# Patient Record
Sex: Male | Born: 1955 | Race: White | Hispanic: No | Marital: Married | Smoking: Never smoker
Health system: Southern US, Community
[De-identification: ages and names within clinical notes are randomized; demographics above are authoritative.]

## PROBLEM LIST (undated history)

## (undated) DIAGNOSIS — H9190 Unspecified hearing loss, unspecified ear: Secondary | ICD-10-CM

## (undated) DIAGNOSIS — N200 Calculus of kidney: Secondary | ICD-10-CM

## (undated) DIAGNOSIS — M1990 Unspecified osteoarthritis, unspecified site: Secondary | ICD-10-CM

## (undated) DIAGNOSIS — R000 Tachycardia, unspecified: Secondary | ICD-10-CM

## (undated) DIAGNOSIS — M109 Gout, unspecified: Secondary | ICD-10-CM

## (undated) DIAGNOSIS — I10 Essential (primary) hypertension: Secondary | ICD-10-CM

## (undated) HISTORY — PX: APPENDECTOMY: SHX54

## (undated) HISTORY — PX: OTHER SURGICAL HISTORY: SHX169

## (undated) HISTORY — DX: Gout, unspecified: M10.9

## (undated) HISTORY — DX: Unspecified osteoarthritis, unspecified site: M19.90

## (undated) HISTORY — PX: CHOLECYSTECTOMY: SHX55

## (undated) HISTORY — PX: LITHOTRIPSY: SUR834

---

## 2004-11-10 ENCOUNTER — Ambulatory Visit: Payer: Self-pay | Admitting: Urology

## 2004-11-16 ENCOUNTER — Emergency Department: Payer: Self-pay | Admitting: Internal Medicine

## 2004-12-05 ENCOUNTER — Other Ambulatory Visit: Payer: Self-pay

## 2005-11-21 ENCOUNTER — Emergency Department: Payer: Self-pay | Admitting: Unknown Physician Specialty

## 2005-11-26 ENCOUNTER — Other Ambulatory Visit: Payer: Self-pay

## 2005-11-29 ENCOUNTER — Ambulatory Visit: Payer: Self-pay | Admitting: Urology

## 2005-12-06 ENCOUNTER — Ambulatory Visit: Payer: Self-pay | Admitting: Urology

## 2005-12-24 ENCOUNTER — Ambulatory Visit: Payer: Self-pay | Admitting: Urology

## 2007-08-14 ENCOUNTER — Ambulatory Visit: Payer: Self-pay | Admitting: Family Medicine

## 2009-10-26 ENCOUNTER — Ambulatory Visit: Payer: Self-pay | Admitting: Urology

## 2009-10-27 ENCOUNTER — Ambulatory Visit: Payer: Self-pay | Admitting: Urology

## 2014-03-15 ENCOUNTER — Ambulatory Visit: Payer: Self-pay

## 2014-03-15 ENCOUNTER — Ambulatory Visit: Payer: Self-pay | Admitting: Physician Assistant

## 2014-03-15 LAB — URINALYSIS, COMPLETE
BILIRUBIN, UR: NEGATIVE
Glucose,UR: NEGATIVE
KETONE: NEGATIVE
NITRITE: POSITIVE
PH: 5.5 (ref 5.0–8.0)
Specific Gravity: 1.02 (ref 1.000–1.030)
Squamous Epithelial: NONE SEEN
WBC UR: >30 /HPF (ref 0–5)

## 2014-03-17 LAB — URINE CULTURE

## 2014-09-24 ENCOUNTER — Emergency Department: Payer: Self-pay | Admitting: Emergency Medicine

## 2014-09-24 LAB — CBC
HCT: 53.3 % — AB (ref 40.0–52.0)
HGB: 18 g/dL (ref 13.0–18.0)
MCH: 28.6 pg (ref 26.0–34.0)
MCHC: 33.7 g/dL (ref 32.0–36.0)
MCV: 85 fL (ref 80–100)
PLATELETS: 189 10*3/uL (ref 150–440)
RBC: 6.27 10*6/uL — AB (ref 4.40–5.90)
RDW: 13.5 % (ref 11.5–14.5)
WBC: 9.7 10*3/uL (ref 3.8–10.6)

## 2014-09-24 LAB — COMPREHENSIVE METABOLIC PANEL
ALBUMIN: 4.7 g/dL
ALK PHOS: 73 U/L
ALT: 37 U/L
Anion Gap: 9 (ref 7–16)
BILIRUBIN TOTAL: 1.9 mg/dL — AB
BUN: 14 mg/dL
CHLORIDE: 107 mmol/L
CO2: 24 mmol/L
Calcium, Total: 9.7 mg/dL
Creatinine: 1.04 mg/dL
EGFR (African American): >60
EGFR (Non-African Amer.): >60
GLUCOSE: 136 mg/dL — AB
Potassium: 4.2 mmol/L
SGOT(AST): 31 U/L
SODIUM: 140 mmol/L
TOTAL PROTEIN: 7.4 g/dL

## 2014-09-24 LAB — URINALYSIS, COMPLETE
Bilirubin,UR: NEGATIVE
GLUCOSE, UR: NEGATIVE mg/dL (ref 0–75)
Ketone: NEGATIVE
NITRITE: NEGATIVE
Ph: 5 (ref 4.5–8.0)
Protein: 100
RBC,UR: 151 /HPF (ref 0–5)
Specific Gravity: 1.023 (ref 1.003–1.030)
Squamous Epithelial: NONE SEEN
WBC UR: 3 /HPF (ref 0–5)

## 2015-02-25 ENCOUNTER — Ambulatory Visit
Admission: RE | Admit: 2015-02-25 | Discharge: 2015-02-25 | Disposition: A | Discharge status: Home / Self Care | Payer: 59 | Source: Ambulatory Visit | Attending: Family Medicine | Admitting: Family Medicine

## 2015-02-25 ENCOUNTER — Other Ambulatory Visit: Payer: Self-pay | Admitting: Family Medicine

## 2015-02-25 DIAGNOSIS — R339 Retention of urine, unspecified: Secondary | ICD-10-CM

## 2015-02-25 DIAGNOSIS — K5900 Constipation, unspecified: Secondary | ICD-10-CM

## 2016-07-14 ENCOUNTER — Emergency Department
Admission: EM | Admit: 2016-07-14 | Discharge: 2016-07-14 | Disposition: A | Discharge status: Home / Self Care | Payer: 59 | Attending: Emergency Medicine | Admitting: Emergency Medicine

## 2016-07-14 ENCOUNTER — Encounter: Payer: Self-pay | Admitting: *Deleted

## 2016-07-14 ENCOUNTER — Emergency Department: Payer: 59

## 2016-07-14 DIAGNOSIS — K5732 Diverticulitis of large intestine without perforation or abscess without bleeding: Secondary | ICD-10-CM | POA: Diagnosis not present

## 2016-07-14 DIAGNOSIS — N200 Calculus of kidney: Secondary | ICD-10-CM | POA: Diagnosis not present

## 2016-07-14 DIAGNOSIS — R109 Unspecified abdominal pain: Secondary | ICD-10-CM | POA: Diagnosis present

## 2016-07-14 DIAGNOSIS — Z79899 Other long term (current) drug therapy: Secondary | ICD-10-CM | POA: Diagnosis not present

## 2016-07-14 DIAGNOSIS — I10 Essential (primary) hypertension: Secondary | ICD-10-CM | POA: Insufficient documentation

## 2016-07-14 HISTORY — DX: Calculus of kidney: N20.0

## 2016-07-14 HISTORY — DX: Essential (primary) hypertension: I10

## 2016-07-14 LAB — BASIC METABOLIC PANEL
Anion gap: 7 (ref 5–15)
BUN: 10 mg/dL (ref 6–20)
CALCIUM: 9.1 mg/dL (ref 8.9–10.3)
CO2: 23 mmol/L (ref 22–32)
CREATININE: 0.94 mg/dL (ref 0.61–1.24)
Chloride: 107 mmol/L (ref 101–111)
GFR calc non Af Amer: >60 mL/min (ref >60)
Glucose, Bld: 115 mg/dL — ABNORMAL HIGH (ref 65–99)
Potassium: 4 mmol/L (ref 3.5–5.1)
SODIUM: 137 mmol/L (ref 135–145)

## 2016-07-14 LAB — URINALYSIS, COMPLETE (UACMP) WITH MICROSCOPIC
BACTERIA UA: NONE SEEN
BILIRUBIN URINE: NEGATIVE
Glucose, UA: NEGATIVE mg/dL
KETONES UR: 20 mg/dL — AB
Leukocytes, UA: NEGATIVE
Nitrite: NEGATIVE
Protein, ur: 100 mg/dL — AB
SQUAMOUS EPITHELIAL / LPF: NONE SEEN
Specific Gravity, Urine: 1.019 (ref 1.005–1.030)
pH: 6 (ref 5.0–8.0)

## 2016-07-14 LAB — CBC
HCT: 51.7 % (ref 40.0–52.0)
Hemoglobin: 17.9 g/dL (ref 13.0–18.0)
MCH: 28.9 pg (ref 26.0–34.0)
MCHC: 34.7 g/dL (ref 32.0–36.0)
MCV: 83.2 fL (ref 80.0–100.0)
PLATELETS: 181 10*3/uL (ref 150–440)
RBC: 6.21 MIL/uL — ABNORMAL HIGH (ref 4.40–5.90)
RDW: 12.8 % (ref 11.5–14.5)
WBC: 14.4 10*3/uL — ABNORMAL HIGH (ref 3.8–10.6)

## 2016-07-14 MED ORDER — MORPHINE SULFATE (PF) 4 MG/ML IV SOLN
INTRAVENOUS | Status: AC
  Administered 2016-07-14: 4 mg via INTRAVENOUS
  Filled 2016-07-14: qty 1

## 2016-07-14 MED ORDER — HYDROMORPHONE HCL 1 MG/ML IJ SOLN
1.0000 mg | Freq: Once | INTRAMUSCULAR | Status: AC
  Administered 2016-07-14: 1 mg via INTRAVENOUS
  Filled 2016-07-14: qty 1

## 2016-07-14 MED ORDER — ONDANSETRON HCL 4 MG/2ML IJ SOLN
4.0000 mg | Freq: Once | INTRAMUSCULAR | Status: AC
  Administered 2016-07-14: 4 mg via INTRAVENOUS

## 2016-07-14 MED ORDER — SODIUM CHLORIDE 0.9 % IV BOLUS (SEPSIS)
1000.0000 mL | Freq: Once | INTRAVENOUS | Status: AC
  Administered 2016-07-14: 1000 mL via INTRAVENOUS

## 2016-07-14 MED ORDER — MORPHINE SULFATE (PF) 4 MG/ML IV SOLN
4.0000 mg | Freq: Once | INTRAVENOUS | Status: AC
  Administered 2016-07-14: 4 mg via INTRAVENOUS

## 2016-07-14 MED ORDER — OXYCODONE-ACETAMINOPHEN 5-325 MG PO TABS: 1.0000 | ORAL_TABLET | ORAL | 0 refills | Status: DC | PRN

## 2016-07-14 MED ORDER — AMOXICILLIN-POT CLAVULANATE 875-125 MG PO TABS: 1.0000 | ORAL_TABLET | Freq: Two times a day (BID) | ORAL | 0 refills | Status: AC

## 2016-07-14 MED ORDER — TAMSULOSIN HCL 0.4 MG PO CAPS: 0.4000 mg | ORAL_CAPSULE | Freq: Every day | ORAL | 0 refills | Status: DC

## 2016-07-14 MED ORDER — ONDANSETRON HCL 4 MG/2ML IJ SOLN
INTRAMUSCULAR | Status: AC
  Administered 2016-07-14: 4 mg via INTRAVENOUS
  Filled 2016-07-14: qty 2

## 2016-07-14 MED ORDER — AMOXICILLIN-POT CLAVULANATE 875-125 MG PO TABS
1.0000 | ORAL_TABLET | Freq: Once | ORAL | Status: AC
  Administered 2016-07-14: 1 via ORAL
  Filled 2016-07-14: qty 1

## 2016-07-14 MED ORDER — ONDANSETRON HCL 4 MG PO TABS: 4.0000 mg | ORAL_TABLET | Freq: Every day | ORAL | 1 refills | Status: AC | PRN

## 2016-07-14 NOTE — ED Notes (Signed)
Patient c/o pelvic/flank pain, nausea, urinary retention, frequent urination, decreased urinary flow beginning 07/12/16

## 2016-07-14 NOTE — ED Provider Notes (Signed)
[facility] Emergency Department Provider Note   First MD Initiated Contact with Patient 07/14/16 0530     (approximate)  I have reviewed the triage vital signs and the nursing notes.   HISTORY  Chief Complaint Urinary Retention   HPI Jordan Hughes is a 61 y.o. male with bolus of chronic medical conditions presents to the emergency department with bilateral flank pain that is currently 9 out of 10 as well as feeling of inability to urinate 1 day. Patient states symptoms consistent with previous episodes of kidney stones. Patient admits to nausea however no vomiting.   Past Medical History:  Diagnosis Date  . Hypertension   . Kidney stones     There are no active problems to display for this patient.   Past Surgical History:  Procedure Laterality Date  . APPENDECTOMY    . CHOLECYSTECTOMY      Prior to Admission medications   Medication Sig Start Date End Date Taking? Authorizing Provider  ibuprofen (ADVIL,MOTRIN) 200 MG tablet Take 400 mg by mouth daily.   Yes Historical Provider, MD  lisinopril (PRINIVIL,ZESTRIL) 20 MG tablet Take 20 mg by mouth daily. 04/13/16  Yes Historical Provider, MD  tamsulosin (FLOMAX) 0.4 MG CAPS capsule Take 0.4 mg by mouth daily. 06/17/16  Yes Historical Provider, MD  amoxicillin-clavulanate (AUGMENTIN) 875-125 MG tablet Take 1 tablet by mouth 2 (two) times daily. 07/14/16 07/28/16  Gregor Hams, MD  ondansetron (ZOFRAN) 4 MG tablet Take 1 tablet (4 mg total) by mouth daily as needed for nausea or vomiting. 07/14/16 07/14/17  Gregor Hams, MD  oxyCODONE-acetaminophen (ROXICET) 5-325 MG tablet Take 1 tablet by mouth every 4 (four) hours as needed for severe pain. 07/14/16   Gregor Hams, MD  tamsulosin (FLOMAX) 0.4 MG CAPS capsule Take 1 capsule (0.4 mg total) by mouth daily after breakfast. 07/14/16   Gregor Hams, MD    Allergies Patient has no known allergies.  History reviewed. No pertinent family  history.  Social History Social History  Substance Use Topics  . Smoking status: Never Smoker  . Smokeless tobacco: Never Used  . Alcohol use 6.0 oz/week    10 Shots of liquor per week     Comment: only on Saturdays    Review of Systems Constitutional: No fever/chills Eyes: No visual changes. ENT: No sore throat. Cardiovascular: Denies chest pain. Respiratory: Denies shortness of breath. Gastrointestinal: No abdominal pain.  No nausea, no vomiting.  No diarrhea.  No constipation. Genitourinary: Negative for dysuria.Positive for urinary retention and flank pain Musculoskeletal: Negative for back pain. Skin: Negative for rash. Neurological: Negative for headaches, focal weakness or numbness.  10-point ROS otherwise negative.  ____________________________________________   PHYSICAL EXAM:  VITAL SIGNS: ED Triage Vitals  Enc Vitals Group     BP 07/14/16 0448 139/83     Pulse Rate 07/14/16 0448 90     Resp 07/14/16 0448 (!) 22     Temp 07/14/16 0448 98.2 F (36.8 C)     Temp Source 07/14/16 0448 Oral     SpO2 07/14/16 0448 98 %     Weight 07/14/16 0449 180 lb (81.6 kg)     Height 07/14/16 0449 5\' 6"  (1.676 m)     Head Circumference --      Peak Flow --      Pain Score 07/14/16 0449 9     Pain Loc --      Pain Edu? --  Excl. in [place]? --     Constitutional: Alert and oriented. Apparent discomfort  Eyes: Conjunctivae are normal. PERRL. EOMI. Head: Atraumatic. Mouth/Throat: Mucous membranes are moist.  Oropharynx non-erythematous. Neck: No stridor.   Cardiovascular: Normal rate, regular rhythm. Good peripheral circulation. Grossly normal heart sounds. Respiratory: Normal respiratory effort.  No retractions. Lungs CTAB. Gastrointestinal: Left lower quadrant tenderness palpation No distention.  Musculoskeletal: No lower extremity tenderness nor edema. No gross deformities of extremities. Neurologic:  Normal speech and language. No gross focal neurologic deficits are  appreciated.  Skin:  Skin is warm, dry and intact. No rash noted. Psychiatric: Mood and affect are normal. Speech and behavior are normal.  ____________________________________________   LABS (all labs ordered are listed, but only abnormal results are displayed)  Labs Reviewed  URINALYSIS, COMPLETE (UACMP) WITH MICROSCOPIC - Abnormal; Notable for the following:       Result Value   Color, Urine YELLOW (*)    APPearance CLEAR (*)    Hgb urine dipstick SMALL (*)    Ketones, ur 20 (*)    Protein, ur 100 (*)    All other components within normal limits  BASIC METABOLIC PANEL - Abnormal; Notable for the following:    Glucose, Bld 115 (*)    All other components within normal limits  CBC - Abnormal; Notable for the following:    WBC 14.4 (*)    RBC 6.21 (*)    All other components within normal limits     RADIOLOGY I, [place] Cyniah Gossard, personally viewed and evaluated these images (plain radiographs) as part of my medical decision making, as well as reviewing the written report by the radiologist.  Ct Renal Stone Study  Result Date: 07/14/2016 CLINICAL DATA:  Bilateral flank pain and hematuria. Low back pain started yesterday. Urine retention. Suprapubic pain. EXAM: CT ABDOMEN AND PELVIS WITHOUT CONTRAST TECHNIQUE: Multidetector CT imaging of the abdomen and pelvis was performed following the standard protocol without IV contrast. COMPARISON:  09/24/2014 FINDINGS: Lower chest: Lung bases are clear. Hepatobiliary: No focal liver abnormality is seen. Status post cholecystectomy. No biliary dilatation. Pancreas: Unremarkable. No pancreatic ductal dilatation or surrounding inflammatory changes. Spleen: Normal in size without focal abnormality. Adrenals/Urinary Tract: No adrenal gland nodules. Multiple bilateral intrarenal stones. Largest is in the right mid pole measuring 3 mm diameter. No hydronephrosis or hydroureter. No ureteral stones or bladder stones. Bladder wall is not thickened.  Stomach/Bowel: Diverticulosis of the sigmoid colon. Inflammatory infiltration in the fat around the sigmoid colon. Appearance is consistent with acute diverticulitis. No abscess. Colon is decompressed. Surgical absence of the uterus. Stomach and small bowel are unremarkable. Vascular/Lymphatic: Aortic atherosclerosis. No enlarged abdominal or pelvic lymph nodes. Reproductive: Prostate gland is enlarged, measuring 4.9 cm diameter. Prostate calcifications. Other: No free air or free fluid in the abdomen. Abdominal wall musculature appears in. Musculoskeletal: Mild degenerative changes in the spine. No destructive bone lesions. IMPRESSION: Sigmoid colon diverticulosis with inflammatory changes consistent with diverticulitis. No abscess. Multiple bilateral nonobstructing renal stones. Electronically Signed   By: Lucienne Capers M.D.   On: 07/14/2016 06:56    _____  Procedures     INITIAL IMPRESSION / ASSESSMENT AND PLAN / ED COURSE  Pertinent labs & imaging results that were available during my care of the patient were reviewed by me and considered in my medical decision making (see chart for details).  61 year old male presenting with bilateral flank pain concern for nephrolithiasis/ureterolithiasis given history of multiple kidney stones in the past. CT scan  revealed multiple bilateral nonobstructing renal stones as well as diverticulitis as such patient received IV morphine for pain control as well as Zofran for nausea. Patient given Augmentin and will be prescribed same for home   Clinical Course     ____________________________________________  FINAL CLINICAL IMPRESSION(S) / ED DIAGNOSES  Final diagnoses:  Kidney stones  Diverticulitis of large intestine without perforation or abscess without bleeding     MEDICATIONS GIVEN DURING THIS VISIT:  Medications  morphine 4 MG/ML injection 4 mg (4 mg Intravenous Given 07/14/16 0616)  ondansetron (ZOFRAN) injection 4 mg (4 mg Intravenous  Given 07/14/16 0614)  sodium chloride 0.9 % bolus 1,000 mL (0 mLs Intravenous Stopped 07/14/16 0722)  amoxicillin-clavulanate (AUGMENTIN) 875-125 MG per tablet 1 tablet (1 tablet Oral Given 07/14/16 0812)  HYDROmorphone (DILAUDID) injection 1 mg (1 mg Intravenous Given 07/14/16 0715)     NEW OUTPATIENT MEDICATIONS STARTED DURING THIS VISIT:  Discharge Medication List as of 07/14/2016  7:09 AM    START taking these medications   Details  amoxicillin-clavulanate (AUGMENTIN) 875-125 MG tablet Take 1 tablet by mouth 2 (two) times daily., Starting Sat 07/14/2016, Until Sat 07/28/2016, Print    ondansetron (ZOFRAN) 4 MG tablet Take 1 tablet (4 mg total) by mouth daily as needed for nausea or vomiting., Starting Sat 07/14/2016, Until Sun 07/14/2017, Print    oxyCODONE-acetaminophen (ROXICET) 5-325 MG tablet Take 1 tablet by mouth every 4 (four) hours as needed for severe pain., Starting Sat 07/14/2016, Print    !! tamsulosin (FLOMAX) 0.4 MG CAPS capsule Take 1 capsule (0.4 mg total) by mouth daily after breakfast., Starting Sat 07/14/2016, Print     !! - Potential duplicate medications found. Please discuss with provider.      Discharge Medication List as of 07/14/2016  7:09 AM      Discharge Medication List as of 07/14/2016  7:09 AM       Note:  This document was prepared using Dragon voice recognition software and may include unintentional dictation errors.    Gregor Hams, MD 07/15/16 [phone]

## 2016-07-14 NOTE — ED Notes (Signed)
Sarah NT and Eliezer Lofts RN performed in and out cath

## 2016-07-14 NOTE — ED Notes (Signed)
This tech bladder scanned pt with RN, Apolonio Schneiders assistance. Amt of urine found in bladder was 32ml

## 2016-07-14 NOTE — ED Triage Notes (Signed)
Pt presents w/ c/o urinary retention and low back pain starting yesterday. Pt states he is "leaking" urine but cannot produce a good stream. Pt c/o suprapubic pain. Pt ambulatory to stat desk where he was able to squat down to exhibit how much pain he was presently experiencing. Pt placed in [place]. Pt hyperventilating during triage process. Pt PWD.

## 2016-07-19 ENCOUNTER — Ambulatory Visit: Payer: Self-pay

## 2016-07-25 NOTE — Progress Notes (Signed)
07/26/2016 9:28 AM   Jordan Hughes 20-Dec-1955 [id number]  Referring provider: Lynnell Jude, MD [address] [place], [place] [id number]  Chief Complaint  Patient presents with  . New Patient (Initial Visit)    kidney stone referred by ER    HPI: Patient is a 61 year old Caucasian male who presents/is referred by [facility] ED for nephrolithiasis.  Patient presented to the ED with the complaint bilateral flank pain that is currently 9 out of 10 as well as feeling of inability to urinate 1 day. Patient states symptoms consistent with previous episodes of kidney stones. Patient admits to nausea however no vomiting.  He states the onset of the pain was two weeks ago.   The pain was in the suprapubic region.   It was dull.  It lasted for several hours.    Nothing made the pain better.   Nothing made the pain worse.  He did have gross hematuria, chills and vomiting.  In the ED, he received antibiotics, IV pain and nausea medications.  His UA was positive for 6-30 RBC's.   Serum creatinine was 0.94.  WBC count was 14.4.    CT Renal stone study performed on 07/14/2016 noted sigmoid colon diverticulosis with inflammatory changes consistent with diverticulitis. No abscess. Multiple bilateral nonobstructing renal stones.  I have independently reviewed the films.  He was discharged with tamsulosin 0.4 mg daily, Augmentin 875/125, Zofran and Percocet.    Today, he is having pain in the mid upper back.  He states he is not passing much urine, but he is not drinking a lot of water.   He complains of dysuria, nocturia x 1, intermittency, hesitancy, straining to urinate and a  weak stream.   His UA today is unremarkable.  His PVR today is 0 mL.    He admits that he does not drink water on a regular bases.  He drinks mostly Coke's and grape juice.    He does have a prior history of stones.  He states he has passed 2 stones six months ago.       PMH: Past Medical History:  Diagnosis Date    . Arthritis   . Gout   . Hypertension   . Kidney stones     Surgical History: Past Surgical History:  Procedure Laterality Date  . abscess tonsil surgery     did not remove  . APPENDECTOMY    . CHOLECYSTECTOMY    . LITHOTRIPSY     patient states 5 times    Home Medications:  Allergies as of 07/26/2016   No Known Allergies     Medication List       Accurate as of 07/26/16  9:28 AM. Always use your most recent med list.          amoxicillin-clavulanate 875-125 MG tablet Commonly known as:  AUGMENTIN Take 1 tablet by mouth 2 (two) times daily.   ibuprofen 200 MG tablet Commonly known as:  ADVIL,MOTRIN Take 400 mg by mouth daily.   lisinopril 20 MG tablet Commonly known as:  PRINIVIL,ZESTRIL Take 20 mg by mouth daily.   ondansetron 4 MG tablet Commonly known as:  ZOFRAN Take 1 tablet (4 mg total) by mouth daily as needed for nausea or vomiting.   oxyCODONE-acetaminophen 5-325 MG tablet Commonly known as:  ROXICET Take 1 tablet by mouth every 4 (four) hours as needed for severe pain.   tamsulosin 0.4 MG Caps capsule Commonly known as:  FLOMAX Take 0.4  mg by mouth daily.   tamsulosin 0.4 MG Caps capsule Commonly known as:  FLOMAX Take 1 capsule (0.4 mg total) by mouth daily after breakfast.       Allergies: No Known Allergies  Family History: Family History  Problem Relation Age of Onset  . Bladder Cancer Paternal Uncle   . Prostate cancer Neg Hx   . Kidney cancer Neg Hx     Social History:  reports that he has never smoked. He has never used smokeless tobacco. He reports that he drinks about 6.0 oz of alcohol per week . He reports that he does not use drugs.  ROS: UROLOGY Frequent Urination?: No Hard to postpone urination?: No Burning/pain with urination?: Yes Get up at night to urinate?: Yes Leakage of urine?: No Urine stream starts and stops?: Yes Trouble starting stream?: Yes Do you have to strain to urinate?: Yes Blood in urine?:  Yes Urinary tract infection?: No Sexually transmitted disease?: No Injury to kidneys or bladder?: No Painful intercourse?: No Weak stream?: Yes Erection problems?: No Penile pain?: No  Gastrointestinal Nausea?: Yes Vomiting?: Yes Indigestion/heartburn?: Yes Diarrhea?: Yes Constipation?: Yes  Constitutional Fever: No Night sweats?: No Weight loss?: No Fatigue?: Yes  Skin Skin rash/lesions?: No Itching?: No  Eyes Blurred vision?: No Double vision?: No  Ears/Nose/Throat Sore throat?: No Sinus problems?: Yes  Hematologic/Lymphatic Swollen glands?: No Easy bruising?: No  Cardiovascular Leg swelling?: No Chest pain?: No  Respiratory Cough?: No Shortness of breath?: No  Endocrine Excessive thirst?: No  Musculoskeletal Back pain?: Yes Joint pain?: Yes  Neurological Headaches?: No Dizziness?: No  Psychologic Depression?: No Anxiety?: No  Physical Exam: BP (!) 148/81   Pulse (!) 55   Temp 98.1 F (36.7 C) (Oral)   Ht 5\' 6"  (1.676 m)   Wt 165 lb 3.2 oz (74.9 kg)   BMI 26.66 kg/m   Constitutional: Well nourished. Alert and oriented, No acute distress. HEENT: [place] AT, moist mucus membranes. Trachea midline, no masses. Cardiovascular: No clubbing, cyanosis, or edema. Respiratory: Normal respiratory effort, no increased work of breathing. GI: Abdomen is soft, non tender, non distended, no abdominal masses. Liver and spleen not palpable.  No hernias appreciated.  Stool sample for occult testing is not indicated.   GU: No CVA tenderness.  No bladder fullness or masses.  Patient with circumcised phallus.  Urethral meatus is patent.  No penile discharge. No penile lesions or rashes. Scrotum without lesions, cysts, rashes and/or edema.  Testicles are located scrotally bilaterally. No masses are appreciated in the testicles. Left and right epididymis are normal. Rectal: Patient with  normal sphincter tone. Anus and perineum without scarring or rashes. No rectal  masses are appreciated. Prostate is approximately 50 grams, no nodules are appreciated. Seminal vesicles are normal. Skin: No rashes, bruises or suspicious lesions. Lymph: No cervical or inguinal adenopathy. Neurologic: Grossly intact, no focal deficits, moving all 4 extremities. Psychiatric: Normal mood and affect.  Laboratory Data: Lab Results  Component Value Date   WBC 14.4 (H) 07/14/2016   HGB 17.9 07/14/2016   HCT 51.7 07/14/2016   MCV 83.2 07/14/2016   PLT 181 07/14/2016    Lab Results  Component Value Date   CREATININE 0.94 07/14/2016    Lab Results  Component Value Date   AST 31 09/24/2014   Lab Results  Component Value Date   ALT 37 09/24/2014     Urinalysis Unremarkable.  See EPIC.   Pertinent Imaging: CLINICAL DATA:  Bilateral flank pain and hematuria. Low  back pain started yesterday. Urine retention. Suprapubic pain.  EXAM: CT ABDOMEN AND PELVIS WITHOUT CONTRAST  TECHNIQUE: Multidetector CT imaging of the abdomen and pelvis was performed following the standard protocol without IV contrast.  COMPARISON:  09/24/2014  FINDINGS: Lower chest: Lung bases are clear.  Hepatobiliary: No focal liver abnormality is seen. Status post cholecystectomy. No biliary dilatation.  Pancreas: Unremarkable. No pancreatic ductal dilatation or surrounding inflammatory changes.  Spleen: Normal in size without focal abnormality.  Adrenals/Urinary Tract: No adrenal gland nodules. Multiple bilateral intrarenal stones. Largest is in the right mid pole measuring 3 mm diameter. No hydronephrosis or hydroureter. No ureteral stones or bladder stones. Bladder wall is not thickened.  Stomach/Bowel: Diverticulosis of the sigmoid colon. Inflammatory infiltration in the fat around the sigmoid colon. Appearance is consistent with acute diverticulitis. No abscess. Colon is decompressed. Surgical absence of the uterus. Stomach and small bowel are  unremarkable.  Vascular/Lymphatic: Aortic atherosclerosis. No enlarged abdominal or pelvic lymph nodes.  Reproductive: Prostate gland is enlarged, measuring 4.9 cm diameter. Prostate calcifications.  Other: No free air or free fluid in the abdomen. Abdominal wall musculature appears in.  Musculoskeletal: Mild degenerative changes in the spine. No destructive bone lesions.  IMPRESSION: Sigmoid colon diverticulosis with inflammatory changes consistent with diverticulitis. No abscess. Multiple bilateral nonobstructing renal stones.   Electronically Signed   By: Lucienne Capers M.D.   On: 07/14/2016 06:56   Assessment & Plan:    1. Bilateral stones  - his current pain is most likely not resulting from the stones as they are not obstructing at this time and his pain is currently located in the upper mid back - he has had bilateral stones for several years  - encouraged patient to eliminate soda's and grape juice from his diet, he should be drinking 2.5 L of water daily  - suggested a 24 hour metabolic work up in the future  - Advised to contact our office or seek treatment in the ED if becomes febrile or pain/ vomiting are difficult control in order to arrange for emergent/urgent intervention  2. Microscopic hematuria  - UA today is unremarkable  - continue to monitor the patient's UA after the treatment/passage of the stone to ensure the hematuria has resolved  - if hematuria persists, we will pursue a hematuria workup with CT Urogram and cystoscopy if appropriate.  3. Diverticulitis  - advised patient to contact his PCP for follow up regarding his diverticulitis  - explained to the patient that we cannot address his stones until he is cleared from his diverticulitis   Return for follow up once recovered from diverticulitis.  These notes generated with voice recognition software. I apologize for typographical errors.  Zara Council, [facility] [address] [place], [place] [address] [phone]

## 2016-07-26 ENCOUNTER — Encounter: Payer: Self-pay | Admitting: Urology

## 2016-07-26 ENCOUNTER — Ambulatory Visit (INDEPENDENT_AMBULATORY_CARE_PROVIDER_SITE_OTHER): Payer: 59 | Admitting: Urology

## 2016-07-26 VITALS — BP 148/81 | HR 55 | Temp 98.1°F | Ht 66.0 in | Wt 165.2 lb

## 2016-07-26 DIAGNOSIS — R3129 Other microscopic hematuria: Secondary | ICD-10-CM | POA: Diagnosis not present

## 2016-07-26 DIAGNOSIS — N200 Calculus of kidney: Secondary | ICD-10-CM

## 2016-07-26 DIAGNOSIS — K5792 Diverticulitis of intestine, part unspecified, without perforation or abscess without bleeding: Secondary | ICD-10-CM

## 2016-07-26 LAB — MICROSCOPIC EXAMINATION: BACTERIA UA: NONE SEEN

## 2016-07-26 LAB — BLADDER SCAN AMB NON-IMAGING: Scan Result: 0

## 2016-07-26 LAB — URINALYSIS, COMPLETE
Bilirubin, UA: NEGATIVE
GLUCOSE, UA: NEGATIVE
Leukocytes, UA: NEGATIVE
NITRITE UA: NEGATIVE
RBC, UA: NEGATIVE
Specific Gravity, UA: 1.02 (ref 1.005–1.030)
Urobilinogen, Ur: 0.2 mg/dL (ref 0.2–1.0)
pH, UA: 6 (ref 5.0–7.5)

## 2016-07-29 LAB — CULTURE, URINE COMPREHENSIVE

## 2016-07-30 ENCOUNTER — Telehealth: Payer: Self-pay

## 2016-07-30 NOTE — Telephone Encounter (Signed)
Spoke with pt in reference to -ucx. Pt voiced understanding.  

## 2016-07-30 NOTE — Telephone Encounter (Signed)
-----   Message from Nori Riis, PA-C sent at 07/29/2016  3:21 PM EST ----- Please notify the patient that his urine culture is negative.

## 2016-08-02 ENCOUNTER — Ambulatory Visit: Payer: Self-pay

## 2017-05-08 ENCOUNTER — Encounter: Payer: Self-pay | Admitting: Gastroenterology

## 2017-05-08 ENCOUNTER — Other Ambulatory Visit
Admission: RE | Admit: 2017-05-08 | Discharge: 2017-05-08 | Disposition: A | Discharge status: Home / Self Care | Payer: 59 | Source: Ambulatory Visit | Attending: Gastroenterology | Admitting: Gastroenterology

## 2017-05-08 ENCOUNTER — Encounter (INDEPENDENT_AMBULATORY_CARE_PROVIDER_SITE_OTHER): Payer: Self-pay

## 2017-05-08 ENCOUNTER — Ambulatory Visit (INDEPENDENT_AMBULATORY_CARE_PROVIDER_SITE_OTHER): Payer: 59 | Admitting: Gastroenterology

## 2017-05-08 ENCOUNTER — Other Ambulatory Visit: Payer: Self-pay

## 2017-05-08 VITALS — BP 94/75 | HR 88 | Temp 98.9°F | Ht 66.0 in | Wt 162.0 lb

## 2017-05-08 DIAGNOSIS — R197 Diarrhea, unspecified: Secondary | ICD-10-CM

## 2017-05-08 DIAGNOSIS — R1084 Generalized abdominal pain: Secondary | ICD-10-CM | POA: Diagnosis not present

## 2017-05-09 ENCOUNTER — Other Ambulatory Visit: Payer: Self-pay

## 2017-05-09 DIAGNOSIS — Z1211 Encounter for screening for malignant neoplasm of colon: Secondary | ICD-10-CM

## 2017-05-09 NOTE — Progress Notes (Signed)
Gastroenterology Consultation  Referring Provider:     Lynnell Jude, MD Primary Care Physician:  Lynnell Jude, MD Primary Gastroenterologist:  Dr. Allen Norris     Reason for Consultation:     Abdominal pain and weight loss        HPI:   Jordan Hughes is a 61 y.o. y/o male referred for consultation & management of abdominal pain and weight loss  by Dr. Clemmie Krill, Lynnell Jude, MD.  This patient comes in today with a history of abdominal pain and weight loss. The patient denies having any black stools or bloody stools but does report a family history of colon cancer. The patient states that it was in his mother. She has not had a colonoscopy in the past. The patient was in the emergency room back in January for diverticulitis. The patient reports that since then he has had abdominal discomfort. He does report that he has had alternating diarrhea and constipation for many years even before that. The patient has lost approximately 30 pounds over the last year without trying. He denies any early satiety fevers chills or nausea and vomiting. The patient was treated with antibiotics in the emergency room and reports that the diverticulitis resolved.  Past Medical History:  Diagnosis Date  . Arthritis   . Gout   . Hypertension   . Kidney stones     Past Surgical History:  Procedure Laterality Date  . abscess tonsil surgery     did not remove  . APPENDECTOMY    . CHOLECYSTECTOMY    . LITHOTRIPSY     patient states 5 times    Prior to Admission medications   Medication Sig Start Date End Date Taking? Authorizing Provider  ibuprofen (ADVIL,MOTRIN) 200 MG tablet Take 400 mg by mouth daily.   Yes [provider]  lisinopril (PRINIVIL,ZESTRIL) 20 MG tablet Take 20 mg by mouth daily. 04/13/16  Yes [provider]  tamsulosin (FLOMAX) 0.4 MG CAPS capsule Take 0.4 mg by mouth daily. 06/17/16  Yes [provider]  ondansetron (ZOFRAN) 4 MG tablet Take 1 tablet (4 mg total) by  mouth daily as needed for nausea or vomiting. Patient not taking: Reported on 05/08/2017 07/14/16 07/14/17  Gregor Hams, MD  oxyCODONE-acetaminophen (ROXICET) 5-325 MG tablet Take 1 tablet by mouth every 4 (four) hours as needed for severe pain. Patient not taking: Reported on 05/08/2017 07/14/16   Gregor Hams, MD  tamsulosin [facility]) 0.4 MG CAPS capsule Take 1 capsule (0.4 mg total) by mouth daily after breakfast. Patient not taking: Reported on 07/26/2016 07/14/16   Gregor Hams, MD    Family History  Problem Relation Age of Onset  . Bladder Cancer Paternal Uncle   . Prostate cancer Neg Hx   . Kidney cancer Neg Hx      Social History   Tobacco Use  . Smoking status: Never Smoker  . Smokeless tobacco: Never Used  Substance Use Topics  . Alcohol use: Yes    Alcohol/week: 6.0 oz    Types: 10 Shots of liquor per week    Comment: only on Saturdays  . Drug use: No    Allergies as of 05/08/2017  . (No Known Allergies)    Review of Systems:    All systems reviewed and negative except where noted in HPI.   Physical Exam:  BP 94/75 (BP Location: Right Arm, Patient Position: Sitting, Cuff Size: Normal)   Pulse 88   Temp 98.9 F (  37.2 C) (Oral)   Ht 5\' 6"  (1.676 m)   Wt 162 lb (73.5 kg)   BMI 26.15 kg/m  No LMP for male patient. Psych:  Alert and cooperative. Normal mood and affect. General:   Alert,  Well-developed, well-nourished, pleasant and cooperative in NAD Head:  Normocephalic and atraumatic. Eyes:  Sclera clear, no icterus.   Conjunctiva pink. Ears:  Normal auditory acuity. Nose:  No deformity, discharge, or lesions. Mouth:  No deformity or lesions,oropharynx pink & moist. Neck:  Supple; no masses or thyromegaly. Lungs:  Respirations even and unlabored.  Clear throughout to auscultation.   No wheezes, crackles, or rhonchi. No acute distress. Heart:  Regular rate and rhythm; no murmurs, clicks, rubs, or gallops. Abdomen:  Normal bowel sounds.  No bruits.   Soft, non-tender and non-distended without masses, hepatosplenomegaly or hernias noted.  No guarding or rebound tenderness.  Negative Carnett sign.   Rectal:  Deferred.  Msk:  Symmetrical without gross deformities.  Good, equal movement & strength bilaterally. Pulses:  Normal pulses noted. Extremities:  No clubbing or edema.  No cyanosis. Neurologic:  Alert and oriented x3;  grossly normal neurologically. Skin:  Intact without significant lesions or rashes.  No jaundice. Lymph Nodes:  No significant cervical adenopathy. Psych:  Alert and cooperative. Normal mood and affect.  Imaging Studies: No results found.  Assessment and Plan:   Jordan Hughes is a 61 y.o. y/o male who had a history of diverticulitis back in January with alternating diarrhea and constipation and a history of weight loss. The patient's weight loss has been unintentional and he does have a family history of colonic neoplasm in his mother. The patient will be set up for a colonoscopy to look for polyps or lesions that may be causing his weight loss and alternating diarrhea and constipation with his abdominal pain. I have discussed risks & benefits which include, but are not limited to, bleeding, infection, perforation & drug reaction.  The patient agrees with this plan & written consent will be obtained.     [name], MD. Marval Regal   Note: This dictation was prepared with Dragon dictation along with smaller phrase technology. Any transcriptional errors that result from this process are unintentional.

## 2017-05-13 ENCOUNTER — Ambulatory Visit
Admission: RE | Admit: 2017-05-13 | Discharge: 2017-05-13 | Disposition: A | Discharge status: Home / Self Care | Payer: 59 | Source: Ambulatory Visit | Attending: Gastroenterology | Admitting: Gastroenterology

## 2017-05-13 ENCOUNTER — Encounter
Admission: RE | Disposition: A | Discharge status: Home / Self Care | Payer: Self-pay | Source: Ambulatory Visit | Attending: Gastroenterology

## 2017-05-13 ENCOUNTER — Ambulatory Visit: Payer: 59 | Admitting: Student in an Organized Health Care Education/Training Program

## 2017-05-13 DIAGNOSIS — K635 Polyp of colon: Secondary | ICD-10-CM | POA: Diagnosis not present

## 2017-05-13 DIAGNOSIS — Z87442 Personal history of urinary calculi: Secondary | ICD-10-CM | POA: Insufficient documentation

## 2017-05-13 DIAGNOSIS — K641 Second degree hemorrhoids: Secondary | ICD-10-CM | POA: Insufficient documentation

## 2017-05-13 DIAGNOSIS — Z79899 Other long term (current) drug therapy: Secondary | ICD-10-CM | POA: Diagnosis not present

## 2017-05-13 DIAGNOSIS — K5730 Diverticulosis of large intestine without perforation or abscess without bleeding: Secondary | ICD-10-CM | POA: Insufficient documentation

## 2017-05-13 DIAGNOSIS — D122 Benign neoplasm of ascending colon: Secondary | ICD-10-CM | POA: Insufficient documentation

## 2017-05-13 DIAGNOSIS — R634 Abnormal weight loss: Secondary | ICD-10-CM

## 2017-05-13 DIAGNOSIS — M109 Gout, unspecified: Secondary | ICD-10-CM | POA: Insufficient documentation

## 2017-05-13 DIAGNOSIS — D125 Benign neoplasm of sigmoid colon: Secondary | ICD-10-CM

## 2017-05-13 DIAGNOSIS — I10 Essential (primary) hypertension: Secondary | ICD-10-CM | POA: Diagnosis not present

## 2017-05-13 DIAGNOSIS — R1084 Generalized abdominal pain: Secondary | ICD-10-CM

## 2017-05-13 HISTORY — PX: COLONOSCOPY WITH PROPOFOL: SHX5780

## 2017-05-13 HISTORY — DX: Unspecified hearing loss, unspecified ear: H91.90

## 2017-05-13 HISTORY — PX: POLYPECTOMY: SHX5525

## 2017-05-13 SURGERY — COLONOSCOPY WITH PROPOFOL
Anesthesia: General | Site: Rectum | Wound class: Contaminated

## 2017-05-13 MED ORDER — LIDOCAINE HCL (CARDIAC) 20 MG/ML IV SOLN
INTRAVENOUS | Status: DC | PRN
  Administered 2017-05-13: 50 mg via INTRAVENOUS

## 2017-05-13 MED ORDER — STERILE WATER FOR IRRIGATION IR SOLN
Status: DC | PRN
  Administered 2017-05-13: 11:00:00

## 2017-05-13 MED ORDER — PROPOFOL 10 MG/ML IV BOLUS
INTRAVENOUS | Status: DC | PRN
  Administered 2017-05-13: 30 mg via INTRAVENOUS
  Administered 2017-05-13 (×2): 40 mg via INTRAVENOUS
  Administered 2017-05-13: 50 mg via INTRAVENOUS
  Administered 2017-05-13 (×2): 20 mg via INTRAVENOUS
  Administered 2017-05-13: 40 mg via INTRAVENOUS
  Administered 2017-05-13: 100 mg via INTRAVENOUS

## 2017-05-13 MED ORDER — LACTATED RINGERS IV SOLN
1000.0000 mL | INTRAVENOUS | Status: DC
  Administered 2017-05-13: 1000 mL via INTRAVENOUS

## 2017-05-13 SURGICAL SUPPLY — 23 items
CANISTER SUCT 1200ML W/VALVE (MISCELLANEOUS) ×3 IMPLANT
CLIP HMST 235XBRD CATH ROT (MISCELLANEOUS) IMPLANT
CLIP RESOLUTION 360 11X235 (MISCELLANEOUS)
FCP ESCP3.2XJMB 240X2.8X (MISCELLANEOUS)
FORCEPS BIOP RAD 4 LRG CAP 4 (CUTTING FORCEPS) IMPLANT
FORCEPS BIOP RJ4 240 W/NDL (MISCELLANEOUS)
FORCEPS ESCP3.2XJMB 240X2.8X (MISCELLANEOUS) IMPLANT
GOWN CVR UNV OPN BCK APRN NK (MISCELLANEOUS) ×2 IMPLANT
GOWN ISOL THUMB LOOP REG UNIV (MISCELLANEOUS) ×4
INJECTOR VARIJECT VIN23 (MISCELLANEOUS) IMPLANT
KIT DEFENDO VALVE AND CONN (KITS) IMPLANT
KIT ENDO PROCEDURE OLY (KITS) ×6 IMPLANT
MARKER SPOT ENDO TATTOO 5ML (MISCELLANEOUS) IMPLANT
PAD GROUND ADULT SPLIT (MISCELLANEOUS) ×3 IMPLANT
PROBE APC STR FIRE (PROBE) IMPLANT
RETRIEVER NET ROTH 2.5X230 LF (MISCELLANEOUS) IMPLANT
SNARE SHORT THROW 13M SML OVAL (MISCELLANEOUS) ×3 IMPLANT
SNARE SHORT THROW 30M LRG OVAL (MISCELLANEOUS) IMPLANT
SNARE SNG USE RND 15MM (INSTRUMENTS) IMPLANT
SPOT EX ENDOSCOPIC TATTOO (MISCELLANEOUS)
TRAP ETRAP POLY (MISCELLANEOUS) ×3 IMPLANT
VARIJECT INJECTOR VIN23 (MISCELLANEOUS)
WATER STERILE IRR 250ML POUR (IV SOLUTION) ×3 IMPLANT

## 2017-05-13 NOTE — Transfer of Care (Signed)
Immediate Anesthesia Transfer of Care Note  Patient: Jordan Hughes  Procedure(s) Performed: COLONOSCOPY WITH PROPOFOL (N/A Rectum) POLYPECTOMY (N/A Rectum)  Patient Location: PACU  Anesthesia Type: General  Level of Consciousness: awake, alert  and patient cooperative  Airway and Oxygen Therapy: Patient Spontanous Breathing and Patient connected to supplemental oxygen  Post-op Assessment: Post-op Vital signs reviewed, Patient's Cardiovascular Status Stable, Respiratory Function Stable, Patent Airway and No signs of Nausea or vomiting  Post-op Vital Signs: Reviewed and stable  Complications: No apparent anesthesia complications

## 2017-05-13 NOTE — Anesthesia Preprocedure Evaluation (Addendum)
Anesthesia Evaluation  Patient identified by MRN, date of birth, ID band Patient awake    Reviewed: Allergy & Precautions, NPO status , Patient's Chart, lab work & pertinent test results, reviewed documented beta blocker date and time   Airway Mallampati: I  TM Distance: >3 FB Neck ROM: Full    Dental no notable dental hx.    Pulmonary neg pulmonary ROS,    Pulmonary exam normal breath sounds clear to auscultation       Cardiovascular hypertension, Normal cardiovascular exam Rhythm:Regular Rate:Normal     Neuro/Psych negative neurological ROS  negative psych ROS   GI/Hepatic negative GI ROS, Neg liver ROS,   Endo/Other  negative endocrine ROS  Renal/GU negative Renal ROS   BPH    Musculoskeletal  (+) Arthritis ,   Abdominal Normal abdominal exam  (+)  Abdomen: soft.    Peds  Hematology negative hematology ROS (+)   Anesthesia Other Findings   Reproductive/Obstetrics                            Anesthesia Physical Anesthesia Plan  ASA: II  Anesthesia Plan: General   Post-op Pain Management:    Induction: Intravenous  PONV Risk Score and Plan:   Airway Management Planned: Nasal Cannula and Natural Airway  Additional Equipment: None  Intra-op Plan:   Post-operative Plan:   Informed Consent: I have reviewed the patients History and Physical, chart, labs and discussed the procedure including the risks, benefits and alternatives for the proposed anesthesia with the patient or authorized representative who has indicated his/her understanding and acceptance.     Plan Discussed with: CRNA, Anesthesiologist and Surgeon  Anesthesia Plan Comments:         Anesthesia Quick Evaluation

## 2017-05-13 NOTE — Discharge Instructions (Signed)
General Anesthesia, Adult, Care After °These instructions provide you with information about caring for yourself after your procedure. Your health care provider may also give you more specific instructions. Your treatment has been planned according to current medical practices, but problems sometimes occur. Call your health care provider if you have any problems or questions after your procedure. °What can I expect after the procedure? °After the procedure, it is common to have: °· Vomiting. °· A sore throat. °· Mental slowness. ° °It is common to feel: °· Nauseous. °· Cold or shivery. °· Sleepy. °· Tired. °· Sore or achy, even in parts of your body where you did not have surgery. ° °Follow these instructions at home: °For at least 24 hours after the procedure: °· Do not: °? Participate in activities where you could fall or become injured. °? Drive. °? Use heavy machinery. °? Drink alcohol. °? Take sleeping pills or medicines that cause drowsiness. °? Make important decisions or sign legal documents. °? Take care of children on your own. °· Rest. °Eating and drinking °· If you vomit, drink water, juice, or soup when you can drink without vomiting. °· Drink enough fluid to keep your urine clear or pale yellow. °· Make sure you have little or no nausea before eating solid foods. °· Follow the diet recommended by your health care provider. °General instructions °· Have a responsible adult stay with you until you are awake and alert. °· Return to your normal activities as told by your health care provider. Ask your health care provider what activities are safe for you. °· Take over-the-counter and prescription medicines only as told by your health care provider. °· If you smoke, do not smoke without supervision. °· Keep all follow-up visits as told by your health care provider. This is important. °Contact a health care provider if: °· You continue to have nausea or vomiting at home, and medicines are not helpful. °· You  cannot drink fluids or start eating again. °· You cannot urinate after 8-12 hours. °· You develop a skin rash. °· You have fever. °· You have increasing redness at the site of your procedure. °Get help right away if: °· You have difficulty breathing. °· You have chest pain. °· You have unexpected bleeding. °· You feel that you are having a life-threatening or urgent problem. °This information is not intended to replace advice given to you by your health care provider. Make sure you discuss any questions you have with your health care provider. °Document Released: 09/24/2000 Document Revised: 11/21/2015 Document Reviewed: 06/02/2015 °Elsevier Interactive Patient Education © 2018 [employer] ° °

## 2017-05-13 NOTE — Anesthesia Postprocedure Evaluation (Signed)
Anesthesia Post Note  Patient: Jordan Hughes  Procedure(s) Performed: COLONOSCOPY WITH PROPOFOL (N/A Rectum) POLYPECTOMY (N/A Rectum)  Patient location during evaluation: PACU Anesthesia Type: General Level of consciousness: awake Pain management: pain level controlled Vital Signs Assessment: post-procedure vital signs reviewed and stable Respiratory status: spontaneous breathing Cardiovascular status: blood pressure returned to baseline Postop Assessment: no headache Anesthetic complications: no    Lavonna Monarch

## 2017-05-13 NOTE — Op Note (Signed)
[facility] Gastroenterology Patient Name: Jordan Hughes Procedure Date: 05/13/2017 11:12 AM MRN: [id number] Account #: [id number] Date of Birth: May 21, 1956 Admit Type: Outpatient Age: 61 Room: [facility] OR ROOM 01 Gender: Male Note Status: Finalized Procedure:            Colonoscopy Indications:          Generalized abdominal pain, Weight loss Providers:            [name] MD, MD Referring MD:         Lynnell Jude (Referring MD) Medicines:            Propofol per Anesthesia Complications:        No immediate complications. Procedure:            Pre-Anesthesia Assessment:                       - Prior to the procedure, a History and Physical was                        performed, and patient medications and allergies were                        reviewed. The patient's tolerance of previous                        anesthesia was also reviewed. The risks and benefits of                        the procedure and the sedation options and risks were                        discussed with the patient. All questions were                        answered, and informed consent was obtained. Prior                        Anticoagulants: The patient has taken no previous                        anticoagulant or antiplatelet agents. ASA Grade                        Assessment: II - A patient with mild systemic disease.                        After reviewing the risks and benefits, the patient was                        deemed in satisfactory condition to undergo the                        procedure.                       After obtaining informed consent, the colonoscope was                        passed under direct vision. Throughout the procedure,  the patient's blood pressure, pulse, and oxygen                        saturations were monitored continuously. The [place] (S#: [id number]) was introduced through                       the anus and advanced to the the cecum, identified by                        appendiceal orifice and ileocecal valve. The                        colonoscopy was performed without difficulty. The                        patient tolerated the procedure well. The quality of                        the bowel preparation was excellent. Findings:      The perianal and digital rectal examinations were normal.      A 4 mm polyp was found in the ascending colon. The polyp was sessile.       The polyp was removed with a cold snare. Resection and retrieval were       complete.      A 7 mm polyp was found in the sigmoid colon. The polyp was pedunculated.       The polyp was removed with a hot snare. Resection and retrieval were       complete.      Multiple small-mouthed diverticula were found in the sigmoid colon and       ascending colon.      Non-bleeding internal hemorrhoids were found during retroflexion. The       hemorrhoids were Grade II (internal hemorrhoids that prolapse but reduce       spontaneously). Impression:           - One 4 mm polyp in the ascending colon, removed with a                        cold snare. Resected and retrieved.                       - One 7 mm polyp in the sigmoid colon, removed with a                        hot snare. Resected and retrieved.                       - Diverticulosis in the sigmoid colon and in the                        ascending colon.                       - Non-bleeding internal hemorrhoids. Recommendation:       - Discharge patient to home.                       -  Resume previous diet.                       - Continue present medications.                       - Repeat colonoscopy in 5 years for surveillance. Procedure Code(s):    --- Professional ---                       [phone], Colonoscopy, flexible; with removal of tumor(s),                        polyp(s), or other lesion(s) by snare technique Diagnosis Code(s):    ---  Professional ---                       R10.84, Generalized abdominal pain                       R63.4, Abnormal weight loss                       D12.2, Benign neoplasm of ascending colon                       D12.5, Benign neoplasm of sigmoid colon CPT copyright 2016 American Medical Association. All rights reserved. The codes documented in this report are preliminary and upon coder review may  be revised to meet current compliance requirements. [name] MD, MD 05/13/2017 11:38:35 AM This report has been signed electronically. Number of Addenda: 0 Note Initiated On: 05/13/2017 11:12 AM Scope Withdrawal Time: 0 hours 8 minutes 32 seconds  Total Procedure Duration: 0 hours 16 minutes 21 seconds       [facility]

## 2017-05-13 NOTE — H&P (Signed)
[name], MD [facility] [address], [place], [place] [address] [phone] Fax : [phone]  Primary Care Physician:  Lynnell Jude, MD Primary Gastroenterologist:  Dr. Allen Norris  Pre-Procedure History & Physical: HPI:  Jordan Hughes is a 61 y.o. male is here for an colonoscopy.   Past Medical History:  Diagnosis Date  . Arthritis    joint pain  . Gout   . HOH (hard of hearing)   . Hypertension    controlled on meds  . Kidney stones     Past Surgical History:  Procedure Laterality Date  . abscess tonsil surgery     did not remove  . APPENDECTOMY    . CHOLECYSTECTOMY    . LITHOTRIPSY     patient states 5 times    Prior to Admission medications   Medication Sig Start Date End Date Taking? Authorizing Provider  lisinopril (PRINIVIL,ZESTRIL) 20 MG tablet Take 20 mg daily by mouth. am 04/13/16  Yes [provider]  tamsulosin (FLOMAX) 0.4 MG CAPS capsule Take 0.4 mg daily by mouth. am 06/17/16  Yes [provider]  ibuprofen (ADVIL,MOTRIN) 200 MG tablet Take 400 mg daily by mouth. Not taking    [provider]  ondansetron (ZOFRAN) 4 MG tablet Take 1 tablet (4 mg total) by mouth daily as needed for nausea or vomiting. Patient not taking: Reported on 05/08/2017 07/14/16 07/14/17  Gregor Hams, MD  oxyCODONE-acetaminophen (ROXICET) 5-325 MG tablet Take 1 tablet by mouth every 4 (four) hours as needed for severe pain. Patient not taking: Reported on 05/08/2017 07/14/16   Gregor Hams, MD  tamsulosin [facility]) 0.4 MG CAPS capsule Take 1 capsule (0.4 mg total) by mouth daily after breakfast. Patient not taking: Reported on 07/26/2016 07/14/16   Gregor Hams, MD    Allergies as of 05/09/2017  . (No Known Allergies)    Family History  Problem Relation Age of Onset  . Bladder Cancer Paternal Uncle   . Prostate cancer Neg Hx   . Kidney cancer Neg Hx     Social History   Socioeconomic History  . Marital status:  Married    Spouse name: Not on file  . Number of children: Not on file  . Years of education: Not on file  . Highest education level: Not on file  Social Needs  . Financial resource strain: Not on file  . Food insecurity - worry: Not on file  . Food insecurity - inability: Not on file  . Transportation needs - medical: Not on file  . Transportation needs - non-medical: Not on file  Occupational History  . Not on file  Tobacco Use  . Smoking status: Never Smoker  . Smokeless tobacco: Never Used  Substance and Sexual Activity  . Alcohol use: Yes    Alcohol/week: 6.0 oz    Types: 10 Shots of liquor per week    Comment: only on Saturdays  . Drug use: No  . Sexual activity: Not on file  Other Topics Concern  . Not on file  Social History Narrative  . Not on file    Review of Systems: See HPI, otherwise negative ROS  Physical Exam: BP (!) 163/93   Pulse (!) 57   Temp 98.4 F (36.9 C) (Tympanic)   Resp 16   Ht 5\' 6"  (1.676 m)   Wt 154 lb (69.9 kg)   SpO2 99%   BMI 24.86 kg/m  General:   Alert,  pleasant and cooperative  in NAD Head:  Normocephalic and atraumatic. Neck:  Supple; no masses or thyromegaly. Lungs:  Clear throughout to auscultation.    Heart:  Regular rate and rhythm. Abdomen:  Soft, nontender and nondistended. Normal bowel sounds, without guarding, and without rebound.   Neurologic:  Alert and  oriented x4;  grossly normal neurologically.  Impression/Plan: Jordan Hughes is here for an colonoscopy to be performed for weight loss and abd pain  Risks, benefits, limitations, and alternatives regarding  colonoscopy have been reviewed with Jordan patient.  Questions have been answered.  All parties agreeable.   [name], MD  05/13/2017, 11:10 AM

## 2017-05-13 NOTE — Anesthesia Procedure Notes (Signed)
Date/Time: 05/13/2017 11:17 AM Performed by: Cameron Ali, CRNA Pre-anesthesia Checklist: Patient identified, Emergency Drugs available, Suction available, Timeout performed and Patient being monitored Patient Re-evaluated:Patient Re-evaluated prior to induction Oxygen Delivery Method: Nasal cannula Placement Confirmation: positive ETCO2

## 2017-05-14 ENCOUNTER — Encounter: Payer: Self-pay | Admitting: Gastroenterology

## 2017-05-14 LAB — ALPHA GALACTOSIDASE: Alpha galactosidase, serum: 41.8 nmol/hr/mg prt (ref 28.0–80.0)

## 2017-05-15 ENCOUNTER — Encounter: Payer: Self-pay | Admitting: Gastroenterology

## 2017-05-15 ENCOUNTER — Telehealth: Payer: Self-pay

## 2017-05-15 NOTE — Telephone Encounter (Signed)
Pt notified of lab results

## 2017-05-15 NOTE — Telephone Encounter (Signed)
-----   Message from [name], MD sent at 05/14/2017  5:34 PM EST ----- Lead the patient know that his blood test was negative for Alpha gal.

## 2017-05-16 ENCOUNTER — Encounter: Payer: Self-pay | Admitting: Gastroenterology

## 2017-11-27 ENCOUNTER — Ambulatory Visit
Admission: RE | Admit: 2017-11-27 | Discharge: 2017-11-27 | Disposition: A | Discharge status: Home / Self Care | Payer: 59 | Source: Ambulatory Visit | Attending: Family Medicine | Admitting: Family Medicine

## 2017-11-27 ENCOUNTER — Other Ambulatory Visit: Payer: Self-pay | Admitting: Family Medicine

## 2017-11-27 DIAGNOSIS — M7731 Calcaneal spur, right foot: Secondary | ICD-10-CM | POA: Diagnosis not present

## 2017-11-27 DIAGNOSIS — M19071 Primary osteoarthritis, right ankle and foot: Secondary | ICD-10-CM | POA: Diagnosis not present

## 2017-11-27 DIAGNOSIS — X58XXXA Exposure to other specified factors, initial encounter: Secondary | ICD-10-CM | POA: Diagnosis not present

## 2017-11-27 DIAGNOSIS — S9031XA Contusion of right foot, initial encounter: Secondary | ICD-10-CM

## 2018-01-29 ENCOUNTER — Other Ambulatory Visit: Payer: Self-pay | Admitting: Podiatry

## 2018-01-29 DIAGNOSIS — D489 Neoplasm of uncertain behavior, unspecified: Secondary | ICD-10-CM

## 2018-02-07 ENCOUNTER — Ambulatory Visit
Admission: RE | Admit: 2018-02-07 | Discharge: 2018-02-07 | Disposition: A | Discharge status: Home / Self Care | Payer: 59 | Source: Ambulatory Visit | Attending: Podiatry | Admitting: Podiatry

## 2018-02-07 DIAGNOSIS — S92521A Displaced fracture of medial phalanx of right lesser toe(s), initial encounter for closed fracture: Secondary | ICD-10-CM | POA: Insufficient documentation

## 2018-02-07 DIAGNOSIS — D489 Neoplasm of uncertain behavior, unspecified: Secondary | ICD-10-CM | POA: Diagnosis not present

## 2018-02-07 DIAGNOSIS — X58XXXA Exposure to other specified factors, initial encounter: Secondary | ICD-10-CM | POA: Insufficient documentation

## 2018-02-07 DIAGNOSIS — M7989 Other specified soft tissue disorders: Secondary | ICD-10-CM | POA: Insufficient documentation

## 2018-02-07 MED ORDER — GADOBENATE DIMEGLUMINE 529 MG/ML IV SOLN
15.0000 mL | Freq: Once | INTRAVENOUS | Status: AC | PRN
  Administered 2018-02-07: 14 mL via INTRAVENOUS

## 2019-04-20 ENCOUNTER — Other Ambulatory Visit: Payer: Self-pay | Admitting: Neurology

## 2019-04-20 ENCOUNTER — Other Ambulatory Visit (HOSPITAL_COMMUNITY): Payer: Self-pay | Admitting: Neurology

## 2019-04-20 DIAGNOSIS — G20 Parkinson's disease: Secondary | ICD-10-CM

## 2019-04-29 ENCOUNTER — Ambulatory Visit (HOSPITAL_COMMUNITY)
Admission: RE | Admit: 2019-04-29 | Discharge: 2019-04-29 | Disposition: A | Discharge status: Home / Self Care | Payer: Medicare Other | Source: Ambulatory Visit | Attending: Neurology | Admitting: Neurology

## 2019-04-29 ENCOUNTER — Other Ambulatory Visit (HOSPITAL_COMMUNITY): Payer: Self-pay | Admitting: Neurology

## 2019-04-29 ENCOUNTER — Other Ambulatory Visit: Payer: Self-pay

## 2019-04-29 DIAGNOSIS — Z77018 Contact with and (suspected) exposure to other hazardous metals: Secondary | ICD-10-CM

## 2019-04-29 DIAGNOSIS — G20 Parkinson's disease: Secondary | ICD-10-CM | POA: Diagnosis present

## 2019-04-29 LAB — CREATININE, SERUM
Creatinine, Ser: 1.32 mg/dL — ABNORMAL HIGH (ref 0.61–1.24)
GFR calc Af Amer: >60 mL/min (ref >60)
GFR calc non Af Amer: 56 mL/min — ABNORMAL LOW (ref >60)

## 2019-04-29 MED ORDER — GADOBUTROL 1 MMOL/ML IV SOLN
7.0000 mL | Freq: Once | INTRAVENOUS | Status: AC | PRN
  Administered 2019-04-29: 20:00:00 7 mL via INTRAVENOUS

## 2019-04-29 NOTE — Progress Notes (Signed)
Pt arrived for MRI that had to be scanned on 3T exam per paper order requesting NeuroQuant sequence. Pts exam was delayed for several reasons; 3T scanner being used and then the need for X-ray orbits per Dr. Elicia Lamp request since pt had prior metallic shavings in his eye marked on pt paper screening form in the department side note the pre-screening questions that question was marked no. Since pt was an outpatient ordering Physician office was called and a page was sent to on call Physician to have X-ray order faxed to department.  Pt and wife frustrated for the delay and reasoning was explained.  Pt was willing to wait but expressed his concern that he was upset for having to wait.

## 2019-07-07 ENCOUNTER — Other Ambulatory Visit: Payer: Self-pay

## 2019-07-07 ENCOUNTER — Encounter: Payer: Self-pay | Admitting: Emergency Medicine

## 2019-07-07 ENCOUNTER — Emergency Department: Payer: Medicare Other

## 2019-07-07 ENCOUNTER — Inpatient Hospital Stay
Admission: EM | Admit: 2019-07-07 | Discharge: 2019-07-09 | DRG: 392 | Disposition: A | Discharge status: Home / Self Care | Payer: Medicare Other | Attending: Internal Medicine | Admitting: Internal Medicine

## 2019-07-07 DIAGNOSIS — N200 Calculus of kidney: Secondary | ICD-10-CM | POA: Diagnosis not present

## 2019-07-07 DIAGNOSIS — M109 Gout, unspecified: Secondary | ICD-10-CM | POA: Diagnosis present

## 2019-07-07 DIAGNOSIS — Z87442 Personal history of urinary calculi: Secondary | ICD-10-CM | POA: Diagnosis not present

## 2019-07-07 DIAGNOSIS — I10 Essential (primary) hypertension: Secondary | ICD-10-CM | POA: Diagnosis present

## 2019-07-07 DIAGNOSIS — M069 Rheumatoid arthritis, unspecified: Secondary | ICD-10-CM | POA: Diagnosis not present

## 2019-07-07 DIAGNOSIS — Z79890 Hormone replacement therapy (postmenopausal): Secondary | ICD-10-CM | POA: Diagnosis not present

## 2019-07-07 DIAGNOSIS — Z86010 Personal history of colonic polyps: Secondary | ICD-10-CM

## 2019-07-07 DIAGNOSIS — M1990 Unspecified osteoarthritis, unspecified site: Secondary | ICD-10-CM | POA: Diagnosis not present

## 2019-07-07 DIAGNOSIS — Z79899 Other long term (current) drug therapy: Secondary | ICD-10-CM

## 2019-07-07 DIAGNOSIS — K5732 Diverticulitis of large intestine without perforation or abscess without bleeding: Secondary | ICD-10-CM | POA: Diagnosis not present

## 2019-07-07 DIAGNOSIS — H9190 Unspecified hearing loss, unspecified ear: Secondary | ICD-10-CM | POA: Diagnosis present

## 2019-07-07 DIAGNOSIS — Z20822 Contact with and (suspected) exposure to covid-19: Secondary | ICD-10-CM | POA: Diagnosis present

## 2019-07-07 DIAGNOSIS — K5792 Diverticulitis of intestine, part unspecified, without perforation or abscess without bleeding: Secondary | ICD-10-CM | POA: Diagnosis present

## 2019-07-07 LAB — URINALYSIS, COMPLETE (UACMP) WITH MICROSCOPIC
Bacteria, UA: NONE SEEN
Bilirubin Urine: NEGATIVE
Glucose, UA: NEGATIVE mg/dL
Hgb urine dipstick: NEGATIVE
Ketones, ur: 20 mg/dL — AB
Nitrite: NEGATIVE
Protein, ur: 100 mg/dL — AB
Specific Gravity, Urine: 1.028 (ref 1.005–1.030)
pH: 6 (ref 5.0–8.0)

## 2019-07-07 LAB — C DIFFICILE QUICK SCREEN W PCR REFLEX
C Diff antigen: NEGATIVE
C Diff interpretation: NOT DETECTED
C Diff toxin: NEGATIVE

## 2019-07-07 LAB — COMPREHENSIVE METABOLIC PANEL
ALT: 27 U/L (ref 0–44)
AST: 28 U/L (ref 15–41)
Albumin: 4.3 g/dL (ref 3.5–5.0)
Alkaline Phosphatase: 59 U/L (ref 38–126)
Anion gap: 11 (ref 5–15)
BUN: 11 mg/dL (ref 8–23)
CO2: 24 mmol/L (ref 22–32)
Calcium: 9.1 mg/dL (ref 8.9–10.3)
Chloride: 103 mmol/L (ref 98–111)
Creatinine, Ser: 0.89 mg/dL (ref 0.61–1.24)
GFR calc Af Amer: >60 mL/min (ref >60)
GFR calc non Af Amer: >60 mL/min (ref >60)
Glucose, Bld: 102 mg/dL — ABNORMAL HIGH (ref 70–99)
Potassium: 3.9 mmol/L (ref 3.5–5.1)
Sodium: 138 mmol/L (ref 135–145)
Total Bilirubin: 2 mg/dL — ABNORMAL HIGH (ref 0.3–1.2)
Total Protein: 7 g/dL (ref 6.5–8.1)

## 2019-07-07 LAB — CBC
HCT: 51.8 % (ref 39.0–52.0)
Hemoglobin: 18.2 g/dL — ABNORMAL HIGH (ref 13.0–17.0)
MCH: 29.4 pg (ref 26.0–34.0)
MCHC: 35.1 g/dL (ref 30.0–36.0)
MCV: 83.8 fL (ref 80.0–100.0)
Platelets: 214 10*3/uL (ref 150–400)
RBC: 6.18 MIL/uL — ABNORMAL HIGH (ref 4.22–5.81)
RDW: 13 % (ref 11.5–15.5)
WBC: 9.1 10*3/uL (ref 4.0–10.5)
nRBC: 0 % (ref 0.0–0.2)

## 2019-07-07 LAB — LIPASE, BLOOD: Lipase: 34 U/L (ref 11–51)

## 2019-07-07 MED ORDER — IOHEXOL 300 MG/ML  SOLN
100.0000 mL | Freq: Once | INTRAMUSCULAR | Status: AC | PRN
  Administered 2019-07-07: 11:00:00 100 mL via INTRAVENOUS
  Filled 2019-07-07: qty 100

## 2019-07-07 MED ORDER — ONDANSETRON 4 MG PO TBDP: 4.0000 mg | ORAL_TABLET | Freq: Three times a day (TID) | ORAL | 0 refills | Status: DC | PRN

## 2019-07-07 MED ORDER — ONDANSETRON HCL 4 MG/2ML IJ SOLN
4.0000 mg | Freq: Three times a day (TID) | INTRAMUSCULAR | Status: DC | PRN
  Administered 2019-07-07: 22:00:00 4 mg via INTRAVENOUS
  Filled 2019-07-07: qty 2

## 2019-07-07 MED ORDER — LISINOPRIL 10 MG PO TABS
10.0000 mg | ORAL_TABLET | Freq: Every day | ORAL | Status: DC
  Administered 2019-07-08 – 2019-07-09 (×2): 10 mg via ORAL
  Filled 2019-07-07 (×2): qty 1

## 2019-07-07 MED ORDER — MORPHINE SULFATE (PF) 4 MG/ML IV SOLN
4.0000 mg | Freq: Once | INTRAVENOUS | Status: AC
  Administered 2019-07-07: 11:00:00 4 mg via INTRAVENOUS
  Filled 2019-07-07: qty 1

## 2019-07-07 MED ORDER — HYDRALAZINE HCL 25 MG PO TABS: 25.0000 mg | ORAL_TABLET | Freq: Three times a day (TID) | ORAL | Status: DC | PRN

## 2019-07-07 MED ORDER — METHOTREXATE 2.5 MG PO TABS
15.0000 mg | ORAL_TABLET | ORAL | Status: DC
  Filled 2019-07-07: qty 6

## 2019-07-07 MED ORDER — PIPERACILLIN-TAZOBACTAM 3.375 G IVPB
3.3750 g | Freq: Three times a day (TID) | INTRAVENOUS | Status: DC
  Administered 2019-07-07 – 2019-07-09 (×6): 3.375 g via INTRAVENOUS
  Filled 2019-07-07 (×6): qty 50

## 2019-07-07 MED ORDER — HYDROMORPHONE HCL 1 MG/ML IJ SOLN
1.0000 mg | INTRAMUSCULAR | Status: DC | PRN
  Administered 2019-07-07: 1 mg via INTRAVENOUS
  Filled 2019-07-07: qty 1

## 2019-07-07 MED ORDER — OXYCODONE-ACETAMINOPHEN 5-325 MG PO TABS: 1.0000 | ORAL_TABLET | ORAL | 0 refills | Status: DC | PRN

## 2019-07-07 MED ORDER — ENOXAPARIN SODIUM 40 MG/0.4ML ~~LOC~~ SOLN
40.0000 mg | SUBCUTANEOUS | Status: DC
  Administered 2019-07-07 – 2019-07-08 (×2): 40 mg via SUBCUTANEOUS
  Filled 2019-07-07 (×2): qty 0.4

## 2019-07-07 MED ORDER — ONDANSETRON HCL 4 MG/2ML IJ SOLN
4.0000 mg | Freq: Once | INTRAMUSCULAR | Status: AC
  Administered 2019-07-07: 11:00:00 4 mg via INTRAVENOUS
  Filled 2019-07-07: qty 2

## 2019-07-07 MED ORDER — MORPHINE SULFATE (PF) 4 MG/ML IV SOLN
4.0000 mg | Freq: Once | INTRAVENOUS | Status: AC
  Administered 2019-07-07: 13:00:00 4 mg via INTRAVENOUS
  Filled 2019-07-07: qty 1

## 2019-07-07 MED ORDER — SODIUM CHLORIDE 0.9 % IV SOLN: INTRAVENOUS | Status: DC

## 2019-07-07 MED ORDER — ACETAMINOPHEN 325 MG PO TABS: 650.0000 mg | ORAL_TABLET | Freq: Four times a day (QID) | ORAL | Status: DC | PRN

## 2019-07-07 MED ORDER — SODIUM CHLORIDE 0.9 % IV SOLN
INTRAVENOUS | Status: DC | PRN
  Administered 2019-07-07: 250 mL via INTRAVENOUS

## 2019-07-07 MED ORDER — FOLIC ACID 1 MG PO TABS
1.0000 mg | ORAL_TABLET | Freq: Every day | ORAL | Status: DC
  Administered 2019-07-08 – 2019-07-09 (×2): 1 mg via ORAL
  Filled 2019-07-07 (×2): qty 1

## 2019-07-07 MED ORDER — TAMSULOSIN HCL 0.4 MG PO CAPS
0.4000 mg | ORAL_CAPSULE | Freq: Every day | ORAL | Status: DC
  Administered 2019-07-08 – 2019-07-09 (×2): 0.4 mg via ORAL
  Filled 2019-07-07 (×2): qty 1

## 2019-07-07 MED ORDER — SODIUM CHLORIDE 0.9 % IV SOLN: Freq: Once | INTRAVENOUS | Status: AC

## 2019-07-07 MED ORDER — CARBIDOPA-LEVODOPA 25-100 MG PO TABS
1.0000 | ORAL_TABLET | Freq: Three times a day (TID) | ORAL | Status: DC
  Filled 2019-07-07 (×4): qty 1

## 2019-07-07 MED ORDER — OXYCODONE-ACETAMINOPHEN 5-325 MG PO TABS: 1.0000 | ORAL_TABLET | ORAL | Status: DC | PRN

## 2019-07-07 NOTE — ED Notes (Signed)
ED Provider at bedside. 

## 2019-07-07 NOTE — Progress Notes (Signed)
Pharmacy Antibiotic Note  Jordan Hughes is a 64 y.o. male admitted on 07/07/2019. Pharmacy has been consulted for Zosyn dosing for diverticulitis.  Plan: Zosyn 3.375 g IV q8h extended infusion  Height: 5\' 6"  (167.6 cm) Weight: 150 lb (68 kg) IBW/kg (Calculated) : 63.8  Temp (24hrs), Avg:98.8 F (37.1 C), Min:98.8 F (37.1 C), Max:98.8 F (37.1 C)  Recent Labs  Lab 07/07/19 0904  WBC 9.1  CREATININE 0.89    Estimated Creatinine Clearance: 74.7 mL/min (by C-G formula based on SCr of 0.89 mg/dL).    No Known Allergies  Antimicrobials this admission: Zosyn 1/5 >>   Dose adjustments this admission: NA  Microbiology results: 1/5 BCx: pending   Thank you for allowing pharmacy to be a part of this patient's care.  Tawnya Crook, PharmD 07/07/2019 1:00 PM

## 2019-07-07 NOTE — H&P (Signed)
History and Physical    Jordan Hughes [id number] DOB: 1956-02-28 DOA: 07/07/2019  Referring MD/NP/PA:   PCP: Lynnell Jude, MD   Patient coming from:  The patient is coming from home.  At baseline, pt is independent for most of ADL.        Chief Complaint: Nausea, vomiting, diarrhea, abdominal pain  HPI: Jordan Hughes is a 64 y.o. male with medical history significant of hypertension, gout, hard of hearing, kidney stone, RA, who presents with nausea vomiting, diarrhea, abdominal pain.  Patient states that he has been having intermittent nausea, vomiting, diarrhea and abdominal pain for more than 10 days.  He has been treated for diverticulitis with his doctor with Augmentin and Flagyl without significant help.  Patient states his symptom has worsened in the past several days.  He has vomited at least 3 times with nonbiliary nonbloody vomitus.  He has 1 or 2 times of of loose stool bowel movement today.  Has decreased oral intake and poor appetite. Patient does not have fever or chills.  No chest pain, shortness breath, cough.  No symptoms of UTI or unilateral weakness.  ED Course: pt was found to have WBC 9.1, negative urinalysis, electrolytes renal function okay, CT abdomen/pelvis that showed diverticulitis in sigmoid without abscess formation.  Review of Systems:   General: no fevers, chills, no body weight gain, has poor appetite, has fatigue HEENT: no blurry vision, hearing changes or sore throat Respiratory: no dyspnea, coughing, wheezing CV: no chest pain, no palpitations GI: has nausea, vomiting, abdominal pain, diarrhea, no constipation GU: no dysuria, burning on urination, increased urinary frequency, hematuria  Ext: no leg edema Neuro: no unilateral weakness, numbness, or tingling, no vision change or hearing loss Skin: no rash, no skin tear. MSK: No muscle spasm, no deformity, no limitation of range of movement in spin Heme: No easy bruising.  Travel history:  No recent long distant travel.  Allergy: No Known Allergies  Past Medical History:  Diagnosis Date  . Arthritis    joint pain  . Gout   . HOH (hard of hearing)   . Hypertension    controlled on meds  . Kidney stones     Past Surgical History:  Procedure Laterality Date  . abscess tonsil surgery     did not remove  . APPENDECTOMY    . CHOLECYSTECTOMY    . COLONOSCOPY WITH PROPOFOL N/A 05/13/2017   Procedure: COLONOSCOPY WITH PROPOFOL;  Surgeon: [name], MD;  Location: [facility];  Service: Endoscopy;  Laterality: N/A;  . LITHOTRIPSY     patient states 5 times  . POLYPECTOMY N/A 05/13/2017   Procedure: POLYPECTOMY;  Surgeon: [name], MD;  Location: [facility];  Service: Endoscopy;  Laterality: N/A;    Social History:  reports that he has never smoked. He has never used smokeless tobacco. He reports current alcohol use of about 10.0 standard drinks of alcohol per week. He reports that he does not use drugs.  Family History:  Family History  Problem Relation Age of Onset  . Bladder Cancer Paternal Uncle   . Prostate cancer Neg Hx   . Kidney cancer Neg Hx      Prior to Admission medications   Medication Sig Start Date End Date Taking? Authorizing Provider  ibuprofen (ADVIL,MOTRIN) 200 MG tablet Take 400 mg daily by mouth. Not taking    [provider]  lisinopril (PRINIVIL,ZESTRIL) 20 MG tablet Take 20 mg daily by mouth. am 04/13/16  [provider]  ondansetron (ZOFRAN ODT) 4 MG disintegrating tablet Take 1 tablet (4 mg total) by mouth every 8 (eight) hours as needed. 07/07/19   Gregor Hams, MD  oxyCODONE-acetaminophen (PERCOCET) 5-325 MG tablet Take 1 tablet by mouth every 4 (four) hours as needed. 07/07/19 07/06/20  Gregor Hams, MD  tamsulosin (FLOMAX) 0.4 MG CAPS capsule Take 0.4 mg daily by mouth. am 06/17/16   [provider]    Physical Exam: Vitals:   07/07/19 0900 07/07/19 1019 07/07/19 1238 07/07/19  1825  BP: 132/90 126/88 133/82 140/78  Pulse: 81 (!) 58 (!) 55 (!) 56  Resp: 16 18 18 20   Temp: 98.8 F (37.1 C)     TempSrc: Oral     SpO2: 99% 100% 99% 100%  Weight:      Height:       General: Not in acute distress HEENT:       Eyes: PERRL, EOMI, no scleral icterus.       ENT: No discharge from the ears and nose, no pharynx injection, no tonsillar enlargement.        Neck: No JVD, no bruit, no mass felt. Heme: No neck lymph node enlargement. Cardiac: S1/S2, RRR, No murmurs, No gallops or rubs. Respiratory: No rales, wheezing, rhonchi or rubs. GI: Soft, nondistended, has abdominal tenderness, worse on the left side, no rebound pain, no organomegaly, BS present. GU: No hematuria Ext: No pitting leg edema bilaterally. 2+DP/PT pulse bilaterally. Musculoskeletal: No joint deformities, No joint redness or warmth, no limitation of ROM in spin. Skin: No rashes.  Neuro: Alert, oriented X3, cranial nerves II-XII grossly intact, moves all extremities normally. Psych: Patient is not psychotic, no suicidal or hemocidal ideation.  Labs on Admission: I have personally reviewed following labs and imaging studies  CBC: Recent Labs  Lab 07/07/19 0904  WBC 9.1  HGB 18.2*  HCT 51.8  MCV 83.8  PLT [id number]   Basic Metabolic Panel: Recent Labs  Lab 07/07/19 0904  NA 138  K 3.9  CL 103  CO2 24  GLUCOSE 102*  BUN 11  CREATININE 0.89  CALCIUM 9.1   GFR: Estimated Creatinine Clearance: 74.7 mL/min (by C-G formula based on SCr of 0.89 mg/dL). Liver Function Tests: Recent Labs  Lab 07/07/19 0904  AST 28  ALT 27  ALKPHOS 59  BILITOT 2.0*  PROT 7.0  ALBUMIN 4.3   Recent Labs  Lab 07/07/19 0904  LIPASE 34   No results for input(s): AMMONIA in the last 168 hours. Coagulation Profile: No results for input(s): INR, PROTIME in the last 168 hours. Cardiac Enzymes: No results for input(s): CKTOTAL, CKMB, CKMBINDEX, TROPONINI in the last 168 hours. BNP (last 3 results) No results  for input(s): PROBNP in the last 8760 hours. HbA1C: No results for input(s): HGBA1C in the last 72 hours. CBG: No results for input(s): GLUCAP in the last 168 hours. Lipid Profile: No results for input(s): CHOL, HDL, LDLCALC, TRIG, CHOLHDL, LDLDIRECT in the last 72 hours. Thyroid Function Tests: No results for input(s): TSH, T4TOTAL, FREET4, T3FREE, THYROIDAB in the last 72 hours. Anemia Panel: No results for input(s): VITAMINB12, FOLATE, FERRITIN, TIBC, IRON, RETICCTPCT in the last 72 hours. Urine analysis:    Component Value Date/Time   COLORURINE AMBER (A) 07/07/2019 0904   APPEARANCEUR CLEAR (A) 07/07/2019 0904   APPEARANCEUR Clear 07/26/2016 0901   LABSPEC 1.028 07/07/2019 0904   LABSPEC 1.023 09/24/2014 1025   PHURINE 6.0 07/07/2019 0904   GLUCOSEU NEGATIVE  07/07/2019 0904   GLUCOSEU Negative 09/24/2014 1025   [place] 07/07/2019 [place] 07/07/2019 0904   BILIRUBINUR Negative 07/26/2016 0901   BILIRUBINUR Negative 09/24/2014 1025   KETONESUR 20 (A) 07/07/2019 0904   PROTEINUR 100 (A) 07/07/2019 0904   NITRITE NEGATIVE 07/07/2019 0904   LEUKOCYTESUR TRACE (A) 07/07/2019 0904   LEUKOCYTESUR Trace 09/24/2014 1025   Sepsis Labs: @LABRCNTIP (procalcitonin:4,lacticidven:4) )No results found for this or any previous visit (from the past 240 hour(s)).   Radiological Exams on Admission: CT ABDOMEN PELVIS W CONTRAST  Result Date: 07/07/2019 CLINICAL DATA:  64 year old male with abdominal pain nausea and vomiting for 1 week. EXAM: CT ABDOMEN AND PELVIS WITH CONTRAST TECHNIQUE: Multidetector CT imaging of the abdomen and pelvis was performed using the standard protocol following bolus administration of intravenous contrast. CONTRAST:  140mL OMNIPAQUE IOHEXOL 300 MG/ML  SOLN COMPARISON:  CT Abdomen and Pelvis 07/14/2016 and earlier. FINDINGS: Lower chest: Negative. Hepatobiliary: Chronically absent gallbladder. Negative liver. Pancreas: Negative. Spleen:  Negative. Adrenals/Urinary Tract: Normal adrenal glands. Left nephrolithiasis measuring up to 4 millimeters in the mid and upper pole. Scattered more punctate right nephrolithiasis. Symmetric renal enhancement and contrast excretion with no renal obstruction. Scattered small benign renal cysts. Negative proximal ureters. Diminutive and unremarkable urinary bladder. Stomach/Bowel: Negative rectum. Sigmoid diverticulosis again noted and there is inflammatory stranding along the anterior sigmoid mesentery most pronounced on series 2, image 66 (also coronal image 36). No extraluminal gas. No fluid collection. Decompressed upstream descending colon with only occasional diverticula. Negative transverse colon. Negative right colon. Sequelae of appendectomy. Negative terminal ileum. No dilated small bowel. Decompressed stomach. Negative duodenum. No free air or free fluid. Vascular/Lymphatic: Calcified aortic atherosclerosis. Major arterial structures in the abdomen and pelvis are patent. Portal venous system is patent. No lymphadenopathy. Reproductive: Prostate enlargement and heterogeneity again noted. Otherwise negative. Other: No pelvic free fluid. Musculoskeletal: Negative for age. IMPRESSION: 1. Acute Diverticulitis of the sigmoid colon. No abscess or complicating features. 2. No other acute or inflammatory process identified in the abdomen or pelvis. 3. Bilateral nephrolithiasis. 4. Aortic Atherosclerosis (ICD10-I70.0). Electronically Signed   By: Genevie Ann M.D.   On: 07/07/2019 10:43     EKG:  Not done in ED, will get one.   Assessment/Plan Principal Problem:   Diverticulitis large intestine Active Problems:   HTN (hypertension)   RA (rheumatoid arthritis) ([place])   Diverticulitis large intestine: Patient failed outpatient antibiotic treatment.  CT abdomen/pelvis that showed sigmoid diverticulitis, no evidence of perforation or abscess formation.  -Placed on MedSurg bed for observation -IV Zosyn -As  needed Zofran for nausea, Dilaudid and Percocet for pain -IV fluid -Blood culture -will check C diff pcr  HTN:  -Continue home medications: -hydralazine prn  RA: pt is on Adalimumab injection and methotrexate -Continue methotrexate    DVT ppx:  SQ Lovenox Code Status: Full code Family Communication: None at bed side.   Disposition Plan:  Anticipate discharge back to previous home environment Consults called:  none Admission status: Med-surg bed for obs   Date of Service 07/07/2019    Ivor Costa [facility]   If 7PM-7AM, please contact night-coverage [email or web address] Password [facility] 07/07/2019, 6:31 PM

## 2019-07-07 NOTE — ED Provider Notes (Signed)
[facility] Emergency Department Provider Note  ____________________________________________   First MD Initiated Contact with Patient 07/07/19 1018     (approximate)  I have reviewed the triage vital signs and the nursing notes.   HISTORY  Chief Complaint Abdominal Pain, Abdominal Cramping, Nausea, and Diarrhea    HPI Jordan Hughes is a 64 y.o. male with below list of previous medical conditions presents emergency department secondary to generalized abdominal pain with associated nausea vomiting and diarrhea x10 days.  Patient denies any fever.  Patient denies any known sick contact.  Patient was seen by his primary care provider Dr. Clemmie Krill and prescribed medications for diverticulitis which he is currently taking.  Patient's pain score is currently 10 out of 10.  Patient denies any aggravating or alleviating factors.        Past Medical History:  Diagnosis Date  . Arthritis    joint pain  . Gout   . HOH (hard of hearing)   . Hypertension    controlled on meds  . Kidney stones     Patient Active Problem List   Diagnosis Date Noted  . Abdominal pain, generalized   . Abnormal weight loss   . Polyp of sigmoid colon   . Benign neoplasm of ascending colon     Past Surgical History:  Procedure Laterality Date  . abscess tonsil surgery     did not remove  . APPENDECTOMY    . CHOLECYSTECTOMY    . COLONOSCOPY WITH PROPOFOL N/A 05/13/2017   Procedure: COLONOSCOPY WITH PROPOFOL;  Surgeon: [name], MD;  Location: [place];  Service: Endoscopy;  Laterality: N/A;  . LITHOTRIPSY     patient states 5 times  . POLYPECTOMY N/A 05/13/2017   Procedure: POLYPECTOMY;  Surgeon: [name], MD;  Location: [facility] [place];  Service: Endoscopy;  Laterality: N/A;    Prior to Admission medications   Medication Sig Start Date End Date Taking? Authorizing Provider  ibuprofen (ADVIL,MOTRIN) 200 MG tablet Take 400 mg daily by mouth. Not  taking    [provider]  lisinopril (PRINIVIL,ZESTRIL) 20 MG tablet Take 20 mg daily by mouth. am 04/13/16   [provider]  ondansetron (ZOFRAN ODT) 4 MG disintegrating tablet Take 1 tablet (4 mg total) by mouth every 8 (eight) hours as needed. 07/07/19   Gregor Hams, MD  oxyCODONE-acetaminophen (PERCOCET) 5-325 MG tablet Take 1 tablet by mouth every 4 (four) hours as needed. 07/07/19 07/06/20  Gregor Hams, MD  tamsulosin (FLOMAX) 0.4 MG CAPS capsule Take 0.4 mg daily by mouth. am 06/17/16   [provider]    Allergies Patient has no known allergies.  Family History  Problem Relation Age of Onset  . Bladder Cancer Paternal Uncle   . Prostate cancer Neg Hx   . Kidney cancer Neg Hx     Social History Social History   Tobacco Use  . Smoking status: Never Smoker  . Smokeless tobacco: Never Used  Substance Use Topics  . Alcohol use: Yes    Alcohol/week: 10.0 standard drinks    Types: 10 Shots of liquor per week    Comment: only on Saturdays  . Drug use: No    Review of Systems Constitutional: No fever/chills Eyes: No visual changes. ENT: No sore throat. Cardiovascular: Denies chest pain. Respiratory: Denies shortness of breath. Gastrointestinal: Positive for generalized abdominal pain nausea and vomiting Genitourinary: Negative for dysuria. Musculoskeletal: Negative for neck pain.  Negative for back pain. Integumentary:  Negative for rash. Neurological: Negative for headaches, focal weakness or numbness.  ____________________________________________   PHYSICAL EXAM:  VITAL SIGNS: ED Triage Vitals  Enc Vitals Group     BP 07/07/19 0900 132/90     Pulse Rate 07/07/19 0900 81     Resp 07/07/19 0900 16     Temp 07/07/19 0900 98.8 F (37.1 C)     Temp Source 07/07/19 0900 Oral     SpO2 07/07/19 0900 99 %     Weight 07/07/19 0854 68 kg (150 lb)     Height 07/07/19 0854 1.676 m (5\' 6" )     Head Circumference --      Peak Flow --        Pain Score 07/07/19 0854 5     Pain Loc --      Pain Edu? --      Excl. in [place]? --     Constitutional: Alert and oriented.  Eyes: Conjunctivae are normal.  Mouth/Throat: Dry oral mucosa Neck: No stridor.  No meningeal signs.   Cardiovascular: Normal rate, regular rhythm. Good peripheral circulation. Grossly normal heart sounds. Respiratory: Normal respiratory effort.  No retractions. Gastrointestinal: Generalized tenderness to palpation.  No guarding no rebound Musculoskeletal: No lower extremity tenderness nor edema. No gross deformities of extremities. Neurologic:  Normal speech and language. No gross focal neurologic deficits are appreciated.  Skin:  Skin is warm, dry and intact. Psychiatric: Mood and affect are normal. Speech and behavior are normal.  ____________________________________________   LABS (all labs ordered are listed, but only abnormal results are displayed)  Labs Reviewed  COMPREHENSIVE METABOLIC PANEL - Abnormal; Notable for the following components:      Result Value   Glucose, Bld 102 (*)    Total Bilirubin 2.0 (*)    All other components within normal limits  CBC - Abnormal; Notable for the following components:   RBC 6.18 (*)    Hemoglobin 18.2 (*)    All other components within normal limits  URINALYSIS, COMPLETE (UACMP) WITH MICROSCOPIC - Abnormal; Notable for the following components:   Color, Urine AMBER (*)    APPearance CLEAR (*)    Ketones, ur 20 (*)    Protein, ur 100 (*)    Leukocytes,Ua TRACE (*)    All other components within normal limits  LIPASE, BLOOD   ___________________________  RADIOLOGY I, [place] Nykia Turko, personally viewed and evaluated these images (plain radiographs) as part of my medical decision making, as well as reviewing the written report by the radiologist.  ED MD interpretation:    Official radiology report(s): CT ABDOMEN PELVIS W CONTRAST  Result Date: 07/07/2019 CLINICAL DATA:  64 year old male with  abdominal pain nausea and vomiting for 1 week. EXAM: CT ABDOMEN AND PELVIS WITH CONTRAST TECHNIQUE: Multidetector CT imaging of the abdomen and pelvis was performed using the standard protocol following bolus administration of intravenous contrast. CONTRAST:  132mL OMNIPAQUE IOHEXOL 300 MG/ML  SOLN COMPARISON:  CT Abdomen and Pelvis 07/14/2016 and earlier. FINDINGS: Lower chest: Negative. Hepatobiliary: Chronically absent gallbladder. Negative liver. Pancreas: Negative. Spleen: Negative. Adrenals/Urinary Tract: Normal adrenal glands. Left nephrolithiasis measuring up to 4 millimeters in the mid and upper pole. Scattered more punctate right nephrolithiasis. Symmetric renal enhancement and contrast excretion with no renal obstruction. Scattered small benign renal cysts. Negative proximal ureters. Diminutive and unremarkable urinary bladder. Stomach/Bowel: Negative rectum. Sigmoid diverticulosis again noted and there is inflammatory stranding along the anterior sigmoid mesentery most pronounced on series 2, image 66 (also coronal  image 36). No extraluminal gas. No fluid collection. Decompressed upstream descending colon with only occasional diverticula. Negative transverse colon. Negative right colon. Sequelae of appendectomy. Negative terminal ileum. No dilated small bowel. Decompressed stomach. Negative duodenum. No free air or free fluid. Vascular/Lymphatic: Calcified aortic atherosclerosis. Major arterial structures in the abdomen and pelvis are patent. Portal venous system is patent. No lymphadenopathy. Reproductive: Prostate enlargement and heterogeneity again noted. Otherwise negative. Other: No pelvic free fluid. Musculoskeletal: Negative for age. IMPRESSION: 1. Acute Diverticulitis of the sigmoid colon. No abscess or complicating features. 2. No other acute or inflammatory process identified in the abdomen or pelvis. 3. Bilateral nephrolithiasis. 4. Aortic Atherosclerosis (ICD10-I70.0). Electronically Signed    By: Genevie Ann M.D.   On: 07/07/2019 10:43     Procedures   ____________________________________________   INITIAL IMPRESSION / MDM / ASSESSMENT AND PLAN / ED COURSE  As part of my medical decision making, I reviewed the following data within the electronic MEDICAL RECORD NUMBER   64 year old male presented with above-stated history and physical exam differential diagnosis concerning for diverticulitis and complications thereof as well as ureterolithiasis.  CT scan consistent with acute sigmoid diverticulitis without any complicating factors.  Patient given IV morphine and Zofran in the emergency department with no improvement of pain at present.  Patient discussed with Dr. Clemmie Krill his primary care provider who informed me that she prescribed Augmentin and Flagyl.  Given worsening symptoms/failed outpatient management patient discussed with Dr. Blaine Hamper hospitalist who will admit the patient for further evaluation and management.  Patient given additional doses of morphine in the emergency department.  ____________________________________________  FINAL CLINICAL IMPRESSION(S) / ED DIAGNOSES  Final diagnoses:  Sigmoid diverticulitis     MEDICATIONS GIVEN DURING THIS VISIT:  Medications  morphine 4 MG/ML injection 4 mg (4 mg Intravenous Given 07/07/19 1047)  ondansetron (ZOFRAN) injection 4 mg (4 mg Intravenous Given 07/07/19 1045)  0.9 %  sodium chloride infusion ( Intravenous New Bag/Given 07/07/19 1045)  iohexol (OMNIPAQUE) 300 MG/ML solution 100 mL (100 mLs Intravenous Contrast Given 07/07/19 1030)     ED Discharge Orders         Ordered    oxyCODONE-acetaminophen (PERCOCET) 5-325 MG tablet  Every 4 hours PRN     07/07/19 1204    ondansetron (ZOFRAN ODT) 4 MG disintegrating tablet  Every 8 hours PRN     07/07/19 1204          *Please note:  Jordan Hughes was evaluated in Emergency Department on 07/07/2019 for the symptoms described in the history of present illness. He was evaluated in  the context of the global COVID-19 pandemic, which necessitated consideration that the patient might be at risk for infection with the SARS-CoV-2 virus that causes COVID-19. Institutional protocols and algorithms that pertain to the evaluation of patients at risk for COVID-19 are in a state of rapid change based on information released by regulatory bodies including the CDC and federal and state organizations. These policies and algorithms were followed during the patient's care in the ED.  Some ED evaluations and interventions may be delayed as a result of limited staffing during the pandemic.*  Note:  This document was prepared using Dragon voice recognition software and may include unintentional dictation errors.   Gregor Hams, MD 07/07/19 1425

## 2019-07-07 NOTE — ED Triage Notes (Signed)
Pt reports and pain for over a week and was advised to come to the ED by Dr. Clemmie Krill to get a CT scan for diverticulitis. Pt reports intermittent NVD as well.

## 2019-07-07 NOTE — ED Notes (Signed)
Hunter RN, aware of bed assigned

## 2019-07-08 DIAGNOSIS — H9190 Unspecified hearing loss, unspecified ear: Secondary | ICD-10-CM | POA: Diagnosis not present

## 2019-07-08 DIAGNOSIS — Z86010 Personal history of colonic polyps: Secondary | ICD-10-CM | POA: Diagnosis not present

## 2019-07-08 DIAGNOSIS — M1990 Unspecified osteoarthritis, unspecified site: Secondary | ICD-10-CM | POA: Diagnosis not present

## 2019-07-08 DIAGNOSIS — I10 Essential (primary) hypertension: Secondary | ICD-10-CM | POA: Diagnosis not present

## 2019-07-08 DIAGNOSIS — K5732 Diverticulitis of large intestine without perforation or abscess without bleeding: Secondary | ICD-10-CM | POA: Diagnosis present

## 2019-07-08 DIAGNOSIS — M109 Gout, unspecified: Secondary | ICD-10-CM | POA: Diagnosis not present

## 2019-07-08 DIAGNOSIS — Z87442 Personal history of urinary calculi: Secondary | ICD-10-CM | POA: Diagnosis not present

## 2019-07-08 DIAGNOSIS — K5792 Diverticulitis of intestine, part unspecified, without perforation or abscess without bleeding: Secondary | ICD-10-CM

## 2019-07-08 DIAGNOSIS — Z20822 Contact with and (suspected) exposure to covid-19: Secondary | ICD-10-CM | POA: Diagnosis not present

## 2019-07-08 DIAGNOSIS — Z79890 Hormone replacement therapy (postmenopausal): Secondary | ICD-10-CM | POA: Diagnosis not present

## 2019-07-08 DIAGNOSIS — N200 Calculus of kidney: Secondary | ICD-10-CM | POA: Diagnosis not present

## 2019-07-08 DIAGNOSIS — Z79899 Other long term (current) drug therapy: Secondary | ICD-10-CM | POA: Diagnosis not present

## 2019-07-08 DIAGNOSIS — M069 Rheumatoid arthritis, unspecified: Secondary | ICD-10-CM | POA: Diagnosis not present

## 2019-07-08 LAB — CBC
HCT: 43.1 % (ref 39.0–52.0)
Hemoglobin: 15 g/dL (ref 13.0–17.0)
MCH: 29.5 pg (ref 26.0–34.0)
MCHC: 34.8 g/dL (ref 30.0–36.0)
MCV: 84.7 fL (ref 80.0–100.0)
Platelets: 186 10*3/uL (ref 150–400)
RBC: 5.09 MIL/uL (ref 4.22–5.81)
RDW: 13 % (ref 11.5–15.5)
WBC: 8.5 10*3/uL (ref 4.0–10.5)
nRBC: 0 % (ref 0.0–0.2)

## 2019-07-08 LAB — BASIC METABOLIC PANEL
Anion gap: 8 (ref 5–15)
BUN: 10 mg/dL (ref 8–23)
CO2: 25 mmol/L (ref 22–32)
Calcium: 8.5 mg/dL — ABNORMAL LOW (ref 8.9–10.3)
Chloride: 108 mmol/L (ref 98–111)
Creatinine, Ser: 1 mg/dL (ref 0.61–1.24)
GFR calc Af Amer: >60 mL/min (ref >60)
GFR calc non Af Amer: >60 mL/min (ref >60)
Glucose, Bld: 104 mg/dL — ABNORMAL HIGH (ref 70–99)
Potassium: 3.9 mmol/L (ref 3.5–5.1)
Sodium: 141 mmol/L (ref 135–145)

## 2019-07-08 LAB — SARS CORONAVIRUS 2 (TAT 6-24 HRS): SARS Coronavirus 2: NEGATIVE

## 2019-07-08 LAB — HIV ANTIBODY (ROUTINE TESTING W REFLEX): HIV Screen 4th Generation wRfx: NONREACTIVE

## 2019-07-08 NOTE — Plan of Care (Signed)
  Problem: Education: Goal: Knowledge of General Education information will improve Description: Including pain rating scale, medication(s)/side effects and non-pharmacologic comfort measures Outcome: Completed/Met

## 2019-07-08 NOTE — Progress Notes (Signed)
PROGRESS NOTE                                                                                                                                                                                                             Patient Demographics:    Jordan Hughes, is a 64 y.o. male, DOB - February 18, 1956, [id number]  Admit date - 07/07/2019   Admitting Physician Louellen Molder, MD  Outpatient Primary MD for the patient is Clemmie Krill, Lynnell Jude, MD  LOS - 0    Chief Complaint  Patient presents with  . Abdominal Pain  . Abdominal Cramping  . Nausea  . Diarrhea       Brief Narrative   64 year old male with hypertension, gout, nephrolithiasis, rheumatoid arthritis presented with nausea, vomiting, diarrhea and abdominal pain.  He was being treated for diverticulitis as outpatient with Augmentin and Flagyl without any improvement. In the ED CT of the abdomen showed sigmoid diverticulitis without abscess or perforation.   Subjective:   Abdominal pain slowly improving.  Tolerating diet.  Denies nausea or vomiting.   Assessment  & Plan :    Principal Problem: Acute diverticulitis of sigmoid colon Failed outpatient antibiotics.  Afebrile.  Continue empiric IV Zosyn and slowly improving.  Tolerating diet.  No nausea, vomiting or diarrhea today.  Stool for C. difficile negative.  Blood culture without any growth. Monitor on the day with IV antibiotic and if improved possible discharge home tomorrow. As needed Percocet for pain, IV Zofran for nausea.  Active Problems: Essential hypertension Continue home meds  Rheumatoid arthritis On adalimumab and methotrexate.  Patient status: Inpatient Patient presenting with acute sigmoid diverticulitis with persistent nausea, vomiting, abdominal pain and diarrhea with failed prolonged outpatient antibiotic treatment.  Patient is at high risk for worsening diverticulitis with development of  abscess and/or perforation and needs to be monitored in an inpatient setting with empiric IV antibiotics and IV fluid.  For this he needs to be monitored as inpatient for at least >2 midnight.  Code Status : Full code  Family Communication  : Wife at bedside  Disposition Plan  : Home possibly tomorrow if continues to improve  Barriers For Discharge : Active symptoms  Consults  : None  Procedures  : CT abdomen pelvis  DVT Prophylaxis  : Subcu Lovenox  Lab Results  Component Value Date  PLT 186 07/08/2019    Antibiotics  :   Anti-infectives (From admission, onward)   Start     Dose/Rate Route Frequency Ordered Stop   07/07/19 1400  piperacillin-tazobactam (ZOSYN) IVPB 3.375 g     3.375 g 12.5 mL/hr over 240 Minutes Intravenous Every 8 hours 07/07/19 1258          Objective:   Vitals:   07/07/19 2243 07/08/19 0545 07/08/19 0815 07/08/19 1521  BP: (!) 148/82 (!) 136/97 125/85 (!) 153/92  Pulse: (!) 52 (!) 58 (!) 57 65  Resp: 16 16 17 17   Temp: 98.3 F (36.8 C) 98 F (36.7 C) 98.1 F (36.7 C) 98.2 F (36.8 C)  TempSrc: Oral Oral Oral Oral  SpO2: 99% 100% 99% 96%  Weight:      Height:        Wt Readings from Last 3 Encounters:  07/07/19 68 kg  05/13/17 69.9 kg  05/08/17 73.5 kg     Intake/Output Summary (Last 24 hours) at 07/08/2019 1611 Last data filed at 07/08/2019 1300 Gross per 24 hour  Intake 360 ml  Output 325 ml  Net 35 ml     Physical Exam  Gen: not in distress HEENT: , moist mucosa, supple neck Chest: clear b/l, no added sounds CVS: N S1&S2, no murmurs,  GI: soft, nondistended, bowel sound present, right lower quadrant tenderness Musculoskeletal: warm, no edema     Data Review:    CBC Recent Labs  Lab 07/07/19 0904 07/08/19 0347  WBC 9.1 8.5  HGB 18.2* 15.0  HCT 51.8 43.1  PLT 214 186  MCV 83.8 84.7  MCH 29.4 29.5  MCHC 35.1 34.8  RDW 13.0 13.0    Chemistries  Recent Labs  Lab 07/07/19 0904 07/08/19 0347  NA 138 141    K 3.9 3.9  CL 103 108  CO2 24 25  GLUCOSE 102* 104*  BUN 11 10  CREATININE 0.89 1.00  CALCIUM 9.1 8.5*  AST 28  --   ALT 27  --   ALKPHOS 59  --   BILITOT 2.0*  --    ------------------------------------------------------------------------------------------------------------------ No results for input(s): CHOL, HDL, LDLCALC, TRIG, CHOLHDL, LDLDIRECT in the last 72 hours.  No results found for: HGBA1C ------------------------------------------------------------------------------------------------------------------ No results for input(s): TSH, T4TOTAL, T3FREE, THYROIDAB in the last 72 hours.  Invalid input(s): FREET3 ------------------------------------------------------------------------------------------------------------------ No results for input(s): VITAMINB12, FOLATE, FERRITIN, TIBC, IRON, RETICCTPCT in the last 72 hours.  Coagulation profile No results for input(s): INR, PROTIME in the last 168 hours.  No results for input(s): DDIMER in the last 72 hours.  Cardiac Enzymes No results for input(s): CKMB, TROPONINI, MYOGLOBIN in the last 168 hours.  Invalid input(s): CK ------------------------------------------------------------------------------------------------------------------ No results found for: BNP  Inpatient Medications  Scheduled Meds: . enoxaparin (LOVENOX) injection  40 mg Subcutaneous Q24H  . folic acid  1 mg Oral Daily  . lisinopril  10 mg Oral Daily  . [START ON 07/09/2019] methotrexate  15 mg Oral Weekly  . tamsulosin  0.4 mg Oral Daily   Continuous Infusions: . sodium chloride 75 mL/hr at 07/08/19 1049  . sodium chloride 250 mL (07/07/19 2351)  . piperacillin-tazobactam (ZOSYN)  IV 3.375 g (07/08/19 1410)   PRN Meds:.sodium chloride, acetaminophen, hydrALAZINE, ondansetron (ZOFRAN) IV, oxyCODONE-acetaminophen  Micro Results Recent Results (from the past 240 hour(s))  SARS CORONAVIRUS 2 (TAT 6-24 HRS) Nasopharyngeal Nasopharyngeal Swab      Status: None   Collection Time: 07/07/19  2:35 PM   Specimen:  Nasopharyngeal Swab  Result Value Ref Range Status   SARS Coronavirus 2 NEGATIVE NEGATIVE Final    Comment: (NOTE) SARS-CoV-2 target nucleic acids are NOT DETECTED. The SARS-CoV-2 RNA is generally detectable in upper and lower respiratory specimens during the acute phase of infection. Negative results do not preclude SARS-CoV-2 infection, do not rule out co-infections with other pathogens, and should not be used as the sole basis for treatment or other patient management decisions. Negative results must be combined with clinical observations, patient history, and epidemiological information. The expected result is Negative. Fact Sheet for Patients: SugarRoll.be Fact Sheet for Healthcare Providers: https://www.woods-mathews.com/ This test is not yet approved or cleared by the [place] FDA and  has been authorized for detection and/or diagnosis of SARS-CoV-2 by FDA under an Emergency Use Authorization (EUA). This EUA will remain  in effect (meaning this test can be used) for the duration of the COVID-19 declaration under Section 56 4(b)(1) of the Act, 21 U.S.C. section 360bbb-3(b)(1), unless the authorization is terminated or revoked sooner. Performed at [facility], [place] [address] [place], [place] [address]   CULTURE, BLOOD (ROUTINE X 2) w Reflex to ID Panel     Status: None (Preliminary result)   Collection Time: 07/07/19  2:35 PM   Specimen: BLOOD  Result Value Ref Range Status   Specimen Description BLOOD BLOOD RIGHT HAND  Final   Special Requests   Final    BOTTLES DRAWN AEROBIC AND ANAEROBIC Blood Culture adequate volume   Culture   Final    NO GROWTH < 24 HOURS Performed at [facility], [address], [place], [place] [address]    Report Status PENDING  Incomplete  CULTURE, BLOOD (ROUTINE X 2) w Reflex to ID Panel     Status: None (Preliminary  result)   Collection Time: 07/07/19  2:35 PM   Specimen: BLOOD  Result Value Ref Range Status   Specimen Description BLOOD RIGHT ANTECUBITAL  Final   Special Requests   Final    BOTTLES DRAWN AEROBIC AND ANAEROBIC Blood Culture adequate volume   Culture   Final    NO GROWTH < 24 HOURS Performed at [facility], [address] [place], [place] [address]    Report Status PENDING  Incomplete  C difficile quick scan w PCR reflex     Status: None   Collection Time: 07/07/19  6:58 PM   Specimen: STOOL  Result Value Ref Range Status   C Diff antigen NEGATIVE NEGATIVE Final   C Diff toxin NEGATIVE NEGATIVE Final   C Diff interpretation No C. difficile detected.  Final    Comment: Performed at [facility], [address] [place], [place] [address]    Radiology Reports CT ABDOMEN PELVIS W CONTRAST  Result Date: 07/07/2019 CLINICAL DATA:  64 year old male with abdominal pain nausea and vomiting for 1 week. EXAM: CT ABDOMEN AND PELVIS WITH CONTRAST TECHNIQUE: Multidetector CT imaging of the abdomen and pelvis was performed using the standard protocol following bolus administration of intravenous contrast. CONTRAST:  184mL OMNIPAQUE IOHEXOL 300 MG/ML  SOLN COMPARISON:  CT Abdomen and Pelvis 07/14/2016 and earlier. FINDINGS: Lower chest: Negative. Hepatobiliary: Chronically absent gallbladder. Negative liver. Pancreas: Negative. Spleen: Negative. Adrenals/Urinary Tract: Normal adrenal glands. Left nephrolithiasis measuring up to 4 millimeters in the mid and upper pole. Scattered more punctate right nephrolithiasis. Symmetric renal enhancement and contrast excretion with no renal obstruction. Scattered small benign renal cysts. Negative proximal ureters. Diminutive and unremarkable urinary bladder.  Stomach/Bowel: Negative rectum. Sigmoid diverticulosis again noted and there is inflammatory stranding along the anterior sigmoid mesentery most pronounced on series 2, image 66 (also  coronal image 36). No extraluminal gas. No fluid collection. Decompressed upstream descending colon with only occasional diverticula. Negative transverse colon. Negative right colon. Sequelae of appendectomy. Negative terminal ileum. No dilated small bowel. Decompressed stomach. Negative duodenum. No free air or free fluid. Vascular/Lymphatic: Calcified aortic atherosclerosis. Major arterial structures in the abdomen and pelvis are patent. Portal venous system is patent. No lymphadenopathy. Reproductive: Prostate enlargement and heterogeneity again noted. Otherwise negative. Other: No pelvic free fluid. Musculoskeletal: Negative for age. IMPRESSION: 1. Acute Diverticulitis of the sigmoid colon. No abscess or complicating features. 2. No other acute or inflammatory process identified in the abdomen or pelvis. 3. Bilateral nephrolithiasis. 4. Aortic Atherosclerosis (ICD10-I70.0). Electronically Signed   By: Genevie Ann M.D.   On: 07/07/2019 10:43    Time Spent in minutes  25   Euva Rundell M.D on 07/08/2019 at 4:11 PM  Between 7am to 7pm - Pager - [phone]  After 7pm go to [email or web address] - password [facility]  [facility] -  Office  [phone]

## 2019-07-09 DIAGNOSIS — N200 Calculus of kidney: Secondary | ICD-10-CM | POA: Diagnosis not present

## 2019-07-09 DIAGNOSIS — K5792 Diverticulitis of intestine, part unspecified, without perforation or abscess without bleeding: Secondary | ICD-10-CM | POA: Diagnosis not present

## 2019-07-09 DIAGNOSIS — I10 Essential (primary) hypertension: Secondary | ICD-10-CM | POA: Diagnosis not present

## 2019-07-09 DIAGNOSIS — K5732 Diverticulitis of large intestine without perforation or abscess without bleeding: Secondary | ICD-10-CM | POA: Diagnosis not present

## 2019-07-09 MED ORDER — AMOXICILLIN-POT CLAVULANATE 875-125 MG PO TABS: 1.0000 | ORAL_TABLET | Freq: Two times a day (BID) | ORAL | 0 refills | Status: DC

## 2019-07-09 NOTE — Discharge Summary (Signed)
Physician Discharge Summary  COURVOISIER VREDEVELD [id number] DOB: 1955/07/11 DOA: 07/07/2019  PCP: Lynnell Jude, MD  Admit date: 07/07/2019 Discharge date: 07/09/2019  Admitted From: Home Disposition: Home  Recommendations for Outpatient Follow-up:  1. Follow-up with PCP in 1 week. 2. Patient will be discharged on 7 days course of oral Augmentin until 1/14  Home Health: None Equipment/Devices: None  Discharge Condition: Fair CODE STATUS: Full code Diet recommendation: Regular    Discharge Diagnoses:  Principal Problem: Acute diverticulitis of sigmoid colon  Active Problems: Essential HTN (hypertension)   RA (rheumatoid arthritis) ([place])   Multiple kidney stones  Brief narrative/HPI 64 year old male with hypertension, gout, nephrolithiasis, rheumatoid arthritis presented with nausea, vomiting, diarrhea and abdominal pain.  He was being treated for diverticulitis as outpatient with Augmentin and Flagyl without any improvement. In the ED CT of the abdomen showed sigmoid diverticulitis without abscess or perforation.  Principal Problem: Acute diverticulitis of sigmoid colon Failed outpatient antibiotics.    Received 48 hours of IV Zosyn and clinically improved. Tolerating diet.  No nausea, vomiting or diarrhea.  Afebrile.  Stool for C. difficile negative.  Blood culture without any growth. Patient received 3-4 days of oral Augmentin as outpatient before he presented to the hospital.  I do not think he has a clear failure of treatment with Augmentin.  Given his significant clinical improvement on IV Zosyn I will place him back on oral Augmentin to complete 7 more days of antibiotics.  Patient instructed to return to the hospital if he has further abdominal pain, fevers, chills, nausea, vomiting or diarrhea.  Active Problems: Essential hypertension Continue home meds  Rheumatoid arthritis On adalimumab and methotrexate.  History of multiple kidney stones Patient has  chronic history of kidney stones.  CT abdomen done in the ED showed left nephrolithiasis measuring up to 4 mm in the mid and upper pole and scattered more punctate right nephrolithiasis.  No bladder distention or hydronephrosis seen.  Renal function stable. This morning he passed a small kidney stone and denies any abdominal pain or discomfort. Instructed to keep himself well-hydrated.  Patient reports that his urologist in May been retired and since then he has not been following with a urologist (refuses to see anyone at present).   Family Communication  :  Spoke with wife during hospitalization  Disposition Plan  : Home   Consults  : None  Procedures  : CT abdomen pelvis  Discharge Instructions   Allergies as of 07/09/2019   No Known Allergies     Medication List    STOP taking these medications   metroNIDAZOLE 500 MG tablet Commonly known as: FLAGYL     TAKE these medications   Adalimumab 40 MG/0.8ML Pskt Inject 40 mg into the skin every 14 (fourteen) days.   amoxicillin-clavulanate 875-125 MG tablet Commonly known as: AUGMENTIN Take 1 tablet by mouth 2 (two) times daily. For 7 days   carbidopa-levodopa 25-100 MG tablet Commonly known as: SINEMET IR Take 1 tablet by mouth 3 (three) times daily.   folic acid 1 MG tablet Commonly known as: FOLVITE Take 1 mg by mouth daily.   ibuprofen 200 MG tablet Commonly known as: ADVIL Take 400 mg daily by mouth. Not taking   lisinopril 10 MG tablet Commonly known as: ZESTRIL Take 10 mg by mouth daily. am   methotrexate 2.5 MG tablet Commonly known as: RHEUMATREX Take 15 mg by mouth once a week.   ondansetron 4 MG disintegrating tablet Commonly known as: Zofran  ODT Take 1 tablet (4 mg total) by mouth every 8 (eight) hours as needed.   tamsulosin 0.4 MG Caps capsule Commonly known as: FLOMAX Take 0.4 mg daily by mouth. am      Follow-up Information    Lynnell Jude, MD.   Specialty: Family Medicine Contact  information: [place] [id number] [phone]          No Known Allergies      Procedures/Studies: CT ABDOMEN PELVIS W CONTRAST  Result Date: 07/07/2019 CLINICAL DATA:  63 year old male with abdominal pain nausea and vomiting for 1 week. EXAM: CT ABDOMEN AND PELVIS WITH CONTRAST TECHNIQUE: Multidetector CT imaging of the abdomen and pelvis was performed using the standard protocol following bolus administration of intravenous contrast. CONTRAST:  152mL OMNIPAQUE IOHEXOL 300 MG/ML  SOLN COMPARISON:  CT Abdomen and Pelvis 07/14/2016 and earlier. FINDINGS: Lower chest: Negative. Hepatobiliary: Chronically absent gallbladder. Negative liver. Pancreas: Negative. Spleen: Negative. Adrenals/Urinary Tract: Normal adrenal glands. Left nephrolithiasis measuring up to 4 millimeters in the mid and upper pole. Scattered more punctate right nephrolithiasis. Symmetric renal enhancement and contrast excretion with no renal obstruction. Scattered small benign renal cysts. Negative proximal ureters. Diminutive and unremarkable urinary bladder. Stomach/Bowel: Negative rectum. Sigmoid diverticulosis again noted and there is inflammatory stranding along the anterior sigmoid mesentery most pronounced on series 2, image 66 (also coronal image 36). No extraluminal gas. No fluid collection. Decompressed upstream descending colon with only occasional diverticula. Negative transverse colon. Negative right colon. Sequelae of appendectomy. Negative terminal ileum. No dilated small bowel. Decompressed stomach. Negative duodenum. No free air or free fluid. Vascular/Lymphatic: Calcified aortic atherosclerosis. Major arterial structures in the abdomen and pelvis are patent. Portal venous system is patent. No lymphadenopathy. Reproductive: Prostate enlargement and heterogeneity again noted. Otherwise negative. Other: No pelvic free fluid. Musculoskeletal: Negative for age. IMPRESSION: 1. Acute Diverticulitis of the  sigmoid colon. No abscess or complicating features. 2. No other acute or inflammatory process identified in the abdomen or pelvis. 3. Bilateral nephrolithiasis. 4. Aortic Atherosclerosis (ICD10-I70.0). Electronically Signed   By: Genevie Ann M.D.   On: 07/07/2019 10:43       Subjective: Reported passing a kidney stone and having some abdominal discomfort.  Symptoms had resolved when I saw him. No other overnight events  Discharge Exam: Vitals:   07/08/19 1521 07/09/19 0749  BP: (!) 153/92 (!) 140/95  Pulse: 65 85  Resp: 17 18  Temp: 98.2 F (36.8 C) 97.8 F (36.6 C)  SpO2: 96% 99%   Vitals:   07/08/19 0545 07/08/19 0815 07/08/19 1521 07/09/19 0749  BP: (!) 136/97 125/85 (!) 153/92 (!) 140/95  Pulse: (!) 58 (!) 57 65 85  Resp: 16 17 17 18   Temp: 98 F (36.7 C) 98.1 F (36.7 C) 98.2 F (36.8 C) 97.8 F (36.6 C)  TempSrc: Oral Oral Oral Oral  SpO2: 100% 99% 96% 99%  Weight:      Height:        General: Elderly male not in distress HEENT: Moist mucosa, supple neck Chest: Clear CVs: Normal S1-S2 GI: Soft, nondistended, nontender, bowel sounds present, no CVA tenderness Musculoskeletal: Warm, no edema    The results of significant diagnostics from this hospitalization (including imaging, microbiology, ancillary and laboratory) are listed below for reference.     Microbiology: Recent Results (from the past 240 hour(s))  SARS CORONAVIRUS 2 (TAT 6-24 HRS) Nasopharyngeal Nasopharyngeal Swab     Status: None   Collection Time: 07/07/19  2:35 PM  Specimen: Nasopharyngeal Swab  Result Value Ref Range Status   SARS Coronavirus 2 NEGATIVE NEGATIVE Final    Comment: (NOTE) SARS-CoV-2 target nucleic acids are NOT DETECTED. The SARS-CoV-2 RNA is generally detectable in upper and lower respiratory specimens during the acute phase of infection. Negative results do not preclude SARS-CoV-2 infection, do not rule out co-infections with other pathogens, and should not be used as  the sole basis for treatment or other patient management decisions. Negative results must be combined with clinical observations, patient history, and epidemiological information. The expected result is Negative. Fact Sheet for Patients: SugarRoll.be Fact Sheet for Healthcare Providers: https://www.woods-mathews.com/ This test is not yet approved or cleared by the [place] FDA and  has been authorized for detection and/or diagnosis of SARS-CoV-2 by FDA under an Emergency Use Authorization (EUA). This EUA will remain  in effect (meaning this test can be used) for the duration of the COVID-19 declaration under Section 56 4(b)(1) of the Act, 21 U.S.C. section 360bbb-3(b)(1), unless the authorization is terminated or revoked sooner. Performed at [facility], [place] [address], [place], [place] [address]   CULTURE, BLOOD (ROUTINE X 2) w Reflex to ID Panel     Status: None (Preliminary result)   Collection Time: 07/07/19  2:35 PM   Specimen: BLOOD  Result Value Ref Range Status   Specimen Description BLOOD BLOOD RIGHT HAND  Final   Special Requests   Final    BOTTLES DRAWN AEROBIC AND ANAEROBIC Blood Culture adequate volume   Culture   Final    NO GROWTH 2 DAYS Performed at [facility], [address] [place], [place] [address]    Report Status PENDING  Incomplete  CULTURE, BLOOD (ROUTINE X 2) w Reflex to ID Panel     Status: None (Preliminary result)   Collection Time: 07/07/19  2:35 PM   Specimen: BLOOD  Result Value Ref Range Status   Specimen Description BLOOD RIGHT ANTECUBITAL  Final   Special Requests   Final    BOTTLES DRAWN AEROBIC AND ANAEROBIC Blood Culture adequate volume   Culture   Final    NO GROWTH 2 DAYS Performed at [facility], [address], [place], [place] [address]    Report Status PENDING  Incomplete  C difficile quick scan w PCR reflex     Status: None   Collection Time: 07/07/19   6:58 PM   Specimen: STOOL  Result Value Ref Range Status   C Diff antigen NEGATIVE NEGATIVE Final   C Diff toxin NEGATIVE NEGATIVE Final   C Diff interpretation No C. difficile detected.  Final    Comment: Performed at [facility], [address] [place], [place] [address]     Labs: BNP (last 3 results) No results for input(s): BNP in the last 8760 hours. Basic Metabolic Panel: Recent Labs  Lab 07/07/19 0904 07/08/19 0347  NA 138 141  K 3.9 3.9  CL 103 108  CO2 24 25  GLUCOSE 102* 104*  BUN 11 10  CREATININE 0.89 1.00  CALCIUM 9.1 8.5*   Liver Function Tests: Recent Labs  Lab 07/07/19 0904  AST 28  ALT 27  ALKPHOS 59  BILITOT 2.0*  PROT 7.0  ALBUMIN 4.3   Recent Labs  Lab 07/07/19 0904  LIPASE 34   No results for input(s): AMMONIA in the last 168 hours. CBC: Recent Labs  Lab 07/07/19 0904 07/08/19 0347  WBC 9.1 8.5  HGB 18.2* 15.0  HCT 51.8  43.1  MCV 83.8 84.7  PLT 214 186   Cardiac Enzymes: No results for input(s): CKTOTAL, CKMB, CKMBINDEX, TROPONINI in the last 168 hours. BNP: Invalid input(s): POCBNP CBG: No results for input(s): GLUCAP in the last 168 hours. D-Dimer No results for input(s): DDIMER in the last 72 hours. Hgb A1c No results for input(s): HGBA1C in the last 72 hours. Lipid Profile No results for input(s): CHOL, HDL, LDLCALC, TRIG, CHOLHDL, LDLDIRECT in the last 72 hours. Thyroid function studies No results for input(s): TSH, T4TOTAL, T3FREE, THYROIDAB in the last 72 hours.  Invalid input(s): FREET3 Anemia work up No results for input(s): VITAMINB12, FOLATE, FERRITIN, TIBC, IRON, RETICCTPCT in the last 72 hours. Urinalysis    Component Value Date/Time   COLORURINE AMBER (A) 07/07/2019 0904   APPEARANCEUR CLEAR (A) 07/07/2019 0904   APPEARANCEUR Clear 07/26/2016 0901   LABSPEC 1.028 07/07/2019 0904   LABSPEC 1.023 09/24/2014 1025   PHURINE 6.0 07/07/2019 0904   GLUCOSEU NEGATIVE 07/07/2019 0904   GLUCOSEU  Negative 09/24/2014 1025   HGBUR NEGATIVE 07/07/2019 0904   BILIRUBINUR NEGATIVE 07/07/2019 0904   BILIRUBINUR Negative 07/26/2016 0901   BILIRUBINUR Negative 09/24/2014 1025   KETONESUR 20 (A) 07/07/2019 0904   PROTEINUR 100 (A) 07/07/2019 0904   NITRITE NEGATIVE 07/07/2019 0904   LEUKOCYTESUR TRACE (A) 07/07/2019 0904   LEUKOCYTESUR Trace 09/24/2014 1025   Sepsis Labs Invalid input(s): PROCALCITONIN,  WBC,  LACTICIDVEN Microbiology Recent Results (from the past 240 hour(s))  SARS CORONAVIRUS 2 (TAT 6-24 HRS) Nasopharyngeal Nasopharyngeal Swab     Status: None   Collection Time: 07/07/19  2:35 PM   Specimen: Nasopharyngeal Swab  Result Value Ref Range Status   SARS Coronavirus 2 NEGATIVE NEGATIVE Final    Comment: (NOTE) SARS-CoV-2 target nucleic acids are NOT DETECTED. The SARS-CoV-2 RNA is generally detectable in upper and lower respiratory specimens during the acute phase of infection. Negative results do not preclude SARS-CoV-2 infection, do not rule out co-infections with other pathogens, and should not be used as the sole basis for treatment or other patient management decisions. Negative results must be combined with clinical observations, patient history, and epidemiological information. The expected result is Negative. Fact Sheet for Patients: SugarRoll.be Fact Sheet for Healthcare Providers: https://www.woods-mathews.com/ This test is not yet approved or cleared by the [place] FDA and  has been authorized for detection and/or diagnosis of SARS-CoV-2 by FDA under an Emergency Use Authorization (EUA). This EUA will remain  in effect (meaning this test can be used) for the duration of the COVID-19 declaration under Section 56 4(b)(1) of the Act, 21 U.S.C. section 360bbb-3(b)(1), unless the authorization is terminated or revoked sooner. Performed at [facility], [place] [address], [place], [place] [address]    CULTURE, BLOOD (ROUTINE X 2) w Reflex to ID Panel     Status: None (Preliminary result)   Collection Time: 07/07/19  2:35 PM   Specimen: BLOOD  Result Value Ref Range Status   Specimen Description BLOOD BLOOD RIGHT HAND  Final   Special Requests   Final    BOTTLES DRAWN AEROBIC AND ANAEROBIC Blood Culture adequate volume   Culture   Final    NO GROWTH 2 DAYS Performed at [facility], [address] [place], [place] [address]    Report Status PENDING  Incomplete  CULTURE, BLOOD (ROUTINE X 2) w Reflex to ID Panel     Status: None (Preliminary result)   Collection Time: 07/07/19  2:35 PM  Specimen: BLOOD  Result Value Ref Range Status   Specimen Description BLOOD RIGHT ANTECUBITAL  Final   Special Requests   Final    BOTTLES DRAWN AEROBIC AND ANAEROBIC Blood Culture adequate volume   Culture   Final    NO GROWTH 2 DAYS Performed at [facility], [address], [place], [place] [address]    Report Status PENDING  Incomplete  C difficile quick scan w PCR reflex     Status: None   Collection Time: 07/07/19  6:58 PM   Specimen: STOOL  Result Value Ref Range Status   C Diff antigen NEGATIVE NEGATIVE Final   C Diff toxin NEGATIVE NEGATIVE Final   C Diff interpretation No C. difficile detected.  Final    Comment: Performed at [facility], [address] [place], [place] [address]     Time coordinating discharge: 35 minutes  SIGNED:   Louellen Molder, MD  [facility] 07/09/2019, 10:25 AM Pager   If 7PM-7AM, please contact night-coverage [email or web address] Password [email or web address]

## 2019-07-09 NOTE — Progress Notes (Signed)
Discharge summary shows zofran to start. Pt stated he doesn't want anyway.

## 2019-07-09 NOTE — Progress Notes (Signed)
Pt education given relating to nurse pt communication. Pt stated he  refuses to use telephone. Advised that is best way, however, AXOx4 to use call light

## 2019-07-09 NOTE — Discharge Instructions (Signed)

## 2019-07-09 NOTE — Progress Notes (Signed)
Pt stated "he has a stone in urine and saving for MD." No reports of flank pain at this time

## 2019-07-12 LAB — CULTURE, BLOOD (ROUTINE X 2)
Culture: NO GROWTH
Culture: NO GROWTH
Special Requests: ADEQUATE
Special Requests: ADEQUATE

## 2019-12-09 ENCOUNTER — Other Ambulatory Visit: Payer: Self-pay

## 2019-12-09 ENCOUNTER — Encounter: Payer: Self-pay | Admitting: Emergency Medicine

## 2019-12-09 ENCOUNTER — Emergency Department: Payer: Medicare Other

## 2019-12-09 ENCOUNTER — Observation Stay
Admission: EM | Admit: 2019-12-09 | Discharge: 2019-12-10 | Disposition: A | Discharge status: Home / Self Care | Payer: Medicare Other | Attending: Internal Medicine | Admitting: Internal Medicine

## 2019-12-09 DIAGNOSIS — Z79899 Other long term (current) drug therapy: Secondary | ICD-10-CM | POA: Insufficient documentation

## 2019-12-09 DIAGNOSIS — M069 Rheumatoid arthritis, unspecified: Secondary | ICD-10-CM | POA: Diagnosis not present

## 2019-12-09 DIAGNOSIS — I10 Essential (primary) hypertension: Secondary | ICD-10-CM | POA: Insufficient documentation

## 2019-12-09 DIAGNOSIS — R627 Adult failure to thrive: Secondary | ICD-10-CM | POA: Diagnosis not present

## 2019-12-09 DIAGNOSIS — E875 Hyperkalemia: Secondary | ICD-10-CM | POA: Insufficient documentation

## 2019-12-09 DIAGNOSIS — N200 Calculus of kidney: Secondary | ICD-10-CM | POA: Diagnosis not present

## 2019-12-09 DIAGNOSIS — Z20822 Contact with and (suspected) exposure to covid-19: Secondary | ICD-10-CM | POA: Diagnosis not present

## 2019-12-09 DIAGNOSIS — R531 Weakness: Secondary | ICD-10-CM | POA: Diagnosis not present

## 2019-12-09 DIAGNOSIS — Z87442 Personal history of urinary calculi: Secondary | ICD-10-CM | POA: Insufficient documentation

## 2019-12-09 DIAGNOSIS — N179 Acute kidney failure, unspecified: Principal | ICD-10-CM | POA: Insufficient documentation

## 2019-12-09 DIAGNOSIS — M1990 Unspecified osteoarthritis, unspecified site: Secondary | ICD-10-CM | POA: Insufficient documentation

## 2019-12-09 DIAGNOSIS — L4050 Arthropathic psoriasis, unspecified: Secondary | ICD-10-CM | POA: Insufficient documentation

## 2019-12-09 LAB — COMPREHENSIVE METABOLIC PANEL
ALT: 29 U/L (ref 0–44)
AST: 21 U/L (ref 15–41)
Albumin: 4.9 g/dL (ref 3.5–5.0)
Alkaline Phosphatase: 73 U/L (ref 38–126)
Anion gap: 10 (ref 5–15)
BUN: 22 mg/dL (ref 8–23)
CO2: 23 mmol/L (ref 22–32)
Calcium: 9.8 mg/dL (ref 8.9–10.3)
Chloride: 99 mmol/L (ref 98–111)
Creatinine, Ser: 1.56 mg/dL — ABNORMAL HIGH (ref 0.61–1.24)
GFR calc Af Amer: 53 mL/min — ABNORMAL LOW (ref >60)
GFR calc non Af Amer: 46 mL/min — ABNORMAL LOW (ref >60)
Glucose, Bld: 103 mg/dL — ABNORMAL HIGH (ref 70–99)
Potassium: 5.6 mmol/L — ABNORMAL HIGH (ref 3.5–5.1)
Sodium: 132 mmol/L — ABNORMAL LOW (ref 135–145)
Total Bilirubin: 2 mg/dL — ABNORMAL HIGH (ref 0.3–1.2)
Total Protein: 7.8 g/dL (ref 6.5–8.1)

## 2019-12-09 LAB — URINALYSIS, COMPLETE (UACMP) WITH MICROSCOPIC
Bacteria, UA: NONE SEEN
Bilirubin Urine: NEGATIVE
Glucose, UA: NEGATIVE mg/dL
Hgb urine dipstick: NEGATIVE
Ketones, ur: NEGATIVE mg/dL
Nitrite: NEGATIVE
Protein, ur: NEGATIVE mg/dL
Specific Gravity, Urine: 1.021 (ref 1.005–1.030)
Squamous Epithelial / HPF: NONE SEEN (ref 0–5)
pH: 5 (ref 5.0–8.0)

## 2019-12-09 LAB — CBC WITH DIFFERENTIAL/PLATELET
Abs Immature Granulocytes: 0.02 10*3/uL (ref 0.00–0.07)
Basophils Absolute: 0 10*3/uL (ref 0.0–0.1)
Basophils Relative: 1 %
Eosinophils Absolute: 0.1 10*3/uL (ref 0.0–0.5)
Eosinophils Relative: 1 %
HCT: 53 % — ABNORMAL HIGH (ref 39.0–52.0)
Hemoglobin: 18.6 g/dL — ABNORMAL HIGH (ref 13.0–17.0)
Immature Granulocytes: 0 %
Lymphocytes Relative: 22 %
Lymphs Abs: 1.6 10*3/uL (ref 0.7–4.0)
MCH: 29.9 pg (ref 26.0–34.0)
MCHC: 35.1 g/dL (ref 30.0–36.0)
MCV: 85.1 fL (ref 80.0–100.0)
Monocytes Absolute: 0.8 10*3/uL (ref 0.1–1.0)
Monocytes Relative: 10 %
Neutro Abs: 4.9 10*3/uL (ref 1.7–7.7)
Neutrophils Relative %: 66 %
Platelets: 264 10*3/uL (ref 150–400)
RBC: 6.23 MIL/uL — ABNORMAL HIGH (ref 4.22–5.81)
RDW: 13.3 % (ref 11.5–15.5)
WBC: 7.4 10*3/uL (ref 4.0–10.5)
nRBC: 0 % (ref 0.0–0.2)

## 2019-12-09 LAB — BASIC METABOLIC PANEL
Anion gap: 5 (ref 5–15)
BUN: 24 mg/dL — ABNORMAL HIGH (ref 8–23)
CO2: 23 mmol/L (ref 22–32)
Calcium: 8.4 mg/dL — ABNORMAL LOW (ref 8.9–10.3)
Chloride: 104 mmol/L (ref 98–111)
Creatinine, Ser: 1.45 mg/dL — ABNORMAL HIGH (ref 0.61–1.24)
GFR calc Af Amer: 58 mL/min — ABNORMAL LOW (ref >60)
GFR calc non Af Amer: 50 mL/min — ABNORMAL LOW (ref >60)
Glucose, Bld: 95 mg/dL (ref 70–99)
Potassium: 6.2 mmol/L — ABNORMAL HIGH (ref 3.5–5.1)
Sodium: 132 mmol/L — ABNORMAL LOW (ref 135–145)

## 2019-12-09 LAB — TSH: TSH: 2.482 u[IU]/mL (ref 0.350–4.500)

## 2019-12-09 LAB — PSA: Prostatic Specific Antigen: 1.4 ng/mL (ref 0.00–4.00)

## 2019-12-09 LAB — T4, FREE: Free T4: 1.22 ng/dL — ABNORMAL HIGH (ref 0.61–1.12)

## 2019-12-09 LAB — TROPONIN I (HIGH SENSITIVITY)
Troponin I (High Sensitivity): 4 ng/L (ref <18)
Troponin I (High Sensitivity): 5 ng/L (ref <18)

## 2019-12-09 LAB — SARS CORONAVIRUS 2 BY RT PCR (HOSPITAL ORDER, PERFORMED IN ~~LOC~~ HOSPITAL LAB): SARS Coronavirus 2: NEGATIVE

## 2019-12-09 MED ORDER — SODIUM CHLORIDE 0.9 % IV BOLUS
1000.0000 mL | Freq: Once | INTRAVENOUS | Status: AC
  Administered 2019-12-09: 1000 mL via INTRAVENOUS

## 2019-12-09 MED ORDER — ACETAMINOPHEN 325 MG PO TABS: 650.0000 mg | ORAL_TABLET | Freq: Four times a day (QID) | ORAL | Status: DC | PRN

## 2019-12-09 MED ORDER — ONDANSETRON HCL 4 MG PO TABS: 4.0000 mg | ORAL_TABLET | Freq: Four times a day (QID) | ORAL | Status: DC | PRN

## 2019-12-09 MED ORDER — ENOXAPARIN SODIUM 40 MG/0.4ML ~~LOC~~ SOLN
40.0000 mg | SUBCUTANEOUS | Status: DC
  Administered 2019-12-10: 40 mg via SUBCUTANEOUS
  Filled 2019-12-09: qty 0.4

## 2019-12-09 MED ORDER — ACETAMINOPHEN 650 MG RE SUPP: 650.0000 mg | Freq: Four times a day (QID) | RECTAL | Status: DC | PRN

## 2019-12-09 MED ORDER — CALCIUM GLUCONATE-NACL 1-0.675 GM/50ML-% IV SOLN
1.0000 g | Freq: Once | INTRAVENOUS | Status: AC
  Administered 2019-12-09: 1000 mg via INTRAVENOUS
  Filled 2019-12-09: qty 50

## 2019-12-09 MED ORDER — IOHEXOL 300 MG/ML  SOLN
100.0000 mL | Freq: Once | INTRAMUSCULAR | Status: AC | PRN
  Administered 2019-12-09: 100 mL via INTRAVENOUS

## 2019-12-09 MED ORDER — SODIUM CHLORIDE 0.9 % IV SOLN: INTRAVENOUS | Status: DC

## 2019-12-09 MED ORDER — INSULIN ASPART 100 UNIT/ML ~~LOC~~ SOLN
SUBCUTANEOUS | Status: AC
  Filled 2019-12-09: qty 1

## 2019-12-09 MED ORDER — INSULIN ASPART 100 UNIT/ML IV SOLN
5.0000 [IU] | Freq: Once | INTRAVENOUS | Status: AC
  Administered 2019-12-09: 5 [IU] via INTRAVENOUS
  Filled 2019-12-09: qty 0.05

## 2019-12-09 MED ORDER — ONDANSETRON HCL 4 MG/2ML IJ SOLN: 4.0000 mg | Freq: Four times a day (QID) | INTRAMUSCULAR | Status: DC | PRN

## 2019-12-09 MED ORDER — SODIUM BICARBONATE 8.4 % IV SOLN
50.0000 meq | Freq: Once | INTRAVENOUS | Status: AC
  Administered 2019-12-09: 50 meq via INTRAVENOUS
  Filled 2019-12-09: qty 50

## 2019-12-09 MED ORDER — ONDANSETRON 4 MG PO TBDP
4.0000 mg | ORAL_TABLET | Freq: Once | ORAL | Status: AC
  Administered 2019-12-09: 4 mg via ORAL
  Filled 2019-12-09: qty 1

## 2019-12-09 MED ORDER — DEXTROSE 50 % IV SOLN
1.0000 | Freq: Once | INTRAVENOUS | Status: AC
  Administered 2019-12-09: 50 mL via INTRAVENOUS
  Filled 2019-12-09: qty 50

## 2019-12-09 NOTE — H&P (Signed)
History and Physical    MONNIE ANSPACH WNI:[id number] DOB: [birth year] DOA: 12/09/2019  PCP: Lynnell Jude, MD   Patient coming from: Home  I have personally briefly reviewed patient's old medical records in [place]  Chief Complaint: Weakness, nausea, poor appetite  HPI: VAUN HYNDMAN is a 64 y.o. male with medical history significant for hypertension, psoriatic arthritis on Humira, nephrolithiasis, and history of diverticulitis, who presents to the emergency room with a complaint of nausea weakness fatigue and decreased appetite associated with weight loss of 5 pounds in the past 3 weeks.  Patient presented to [facility] with similar symptoms a couple weeks prior and was diagnosed with acute prostatitis with CT abdomen and pelvis and was discharged with a 6-week course of Bactrim.  In spite of taking the medication for 2 weeks, patient continues to feel unwell.  He has nausea but denies vomiting.  Denies fever or chills.  Has some difficulty with passing urine but denies burning or pain.  Denies change in bowel habits.  Has no chest pain or shortness of breath ED Course: On arrival, he was afebrile and with normal vitals blood work was significant for creatinine of 1.56 above his baseline of 0.89.  And potassium was 5.6.  Total bili was also elevated at 2.  He was hydrated in the emergency room and repeat BMP showed improvement in creatinine to 1.45 but potassium remained more elevated at 6.2.  He was treated with dextrose and insulin, calcium gluconate.  Hospitalist consulted for admission.  Review of Systems: As per HPI otherwise 10 point review of systems negative.    Past Medical History:  Diagnosis Date  . Arthritis    joint pain  . Gout   . HOH (hard of hearing)   . Hypertension    controlled on meds  . Kidney stones     Past Surgical History:  Procedure Laterality Date  . abscess tonsil surgery     did not remove  . APPENDECTOMY    . CHOLECYSTECTOMY    .  COLONOSCOPY WITH PROPOFOL N/A 05/13/2017   Procedure: COLONOSCOPY WITH PROPOFOL;  Surgeon: [name], MD;  Location: [place];  Service: Endoscopy;  Laterality: N/A;  . LITHOTRIPSY     patient states 5 times  . POLYPECTOMY N/A 05/13/2017   Procedure: POLYPECTOMY;  Surgeon: [name], MD;  Location: [place];  Service: Endoscopy;  Laterality: N/A;     reports that he has never smoked. He has never used smokeless tobacco. He reports current alcohol use of about 10.0 standard drinks of alcohol per week. He reports that he does not use drugs.  No Known Allergies  Family History  Problem Relation Age of Onset  . Bladder Cancer Paternal Uncle   . Prostate cancer Neg Hx   . Kidney cancer Neg Hx      Prior to Admission medications   Medication Sig Start Date End Date Taking? Authorizing Provider  Adalimumab 40 MG/0.8ML PSKT Inject 40 mg into the skin every 14 (fourteen) days.    [provider]  amoxicillin-clavulanate (AUGMENTIN) 875-125 MG tablet Take 1 tablet by mouth 2 (two) times daily. For 7 days 07/09/19   Dhungel, Flonnie Overman, MD  carbidopa-levodopa (SINEMET IR) 25-100 MG tablet Take 1 tablet by mouth 3 (three) times daily. 05/11/19 08/09/19  [provider]  folic acid (FOLVITE) 1 MG tablet Take 1 mg by mouth daily. 03/20/19   [provider]  ibuprofen (ADVIL,MOTRIN) 200 MG tablet  Take 400 mg daily by mouth. Not taking    [provider]  lisinopril (ZESTRIL) 10 MG tablet Take 10 mg by mouth daily. am 04/13/16   [provider]  methotrexate (RHEUMATREX) 2.5 MG tablet Take 15 mg by mouth once a week. 06/07/19   [provider]  ondansetron (ZOFRAN ODT) 4 MG disintegrating tablet Take 1 tablet (4 mg total) by mouth every 8 (eight) hours as needed. 07/07/19   Gregor Hams, MD  tamsulosin (FLOMAX) 0.4 MG CAPS capsule Take 0.4 mg daily by mouth. am 06/17/16   [provider]    Physical Exam: Vitals:    12/09/19 1730 12/09/19 1800 12/09/19 1813 12/09/19 2030  BP: 137/77 107/67 131/76 133/78  Pulse: 69 64  85  Resp: 13 15    Temp:      TempSrc:      SpO2:      Weight:      Height:         Vitals:   12/09/19 1730 12/09/19 1800 12/09/19 1813 12/09/19 2030  BP: 137/77 107/67 131/76 133/78  Pulse: 69 64  85  Resp: 13 15    Temp:      TempSrc:      SpO2:      Weight:      Height:          Constitutional: Alert and oriented x 3 . Not in any apparent distress HEENT:      Head: Normocephalic and atraumatic.         Eyes: PERLA, EOMI, Conjunctivae are normal. Sclera is non-icteric.       Mouth/Throat: Mucous membranes are moist.       Neck: Supple with no signs of meningismus. Cardiovascular: Regular rate and rhythm. No murmurs, gallops, or rubs. 2+ symmetrical distal pulses are present . No JVD. No LE edema Respiratory: Respiratory effort normal .Lungs sounds clear bilaterally. No wheezes, crackles, or rhonchi.  Gastrointestinal: Soft, non tender, and non distended with positive bowel sounds. No rebound or guarding. Genitourinary: No CVA tenderness. Musculoskeletal: Nontender with normal range of motion in all extremities. No edema, cyanosis, or erythema of extremities. Neurologic: Normal speech and language. Face is symmetric. Moving all extremities. No gross focal neurologic deficits . Skin: Skin is warm, dry.  No rash or ulcers Psychiatric: Mood and affect are normal Speech and behavior are normal   Labs on Admission: I have personally reviewed following labs and imaging studies  CBC: Recent Labs  Lab 12/09/19 1123  WBC 7.4  NEUTROABS 4.9  HGB 18.6*  HCT 53.0*  MCV 85.1  PLT 960   Basic Metabolic Panel: Recent Labs  Lab 12/09/19 1123 12/09/19 1759  NA 132* 132*  K 5.6* 6.2*  CL 99 104  CO2 23 23  GLUCOSE 103* 95  BUN 22 24*  CREATININE 1.56* 1.45*  CALCIUM 9.8 8.4*   GFR: Estimated Creatinine Clearance: 45 mL/min (A) (by C-G formula based on SCr of  1.45 mg/dL (H)). Liver Function Tests: Recent Labs  Lab 12/09/19 1123  AST 21  ALT 29  ALKPHOS 73  BILITOT 2.0*  PROT 7.8  ALBUMIN 4.9   No results for input(s): LIPASE, AMYLASE in the last 168 hours. No results for input(s): AMMONIA in the last 168 hours. Coagulation Profile: No results for input(s): INR, PROTIME in the last 168 hours. Cardiac Enzymes: No results for input(s): CKTOTAL, CKMB, CKMBINDEX, TROPONINI in the last 168 hours. BNP (last 3 results) No results for input(s): PROBNP  in the last 8760 hours. HbA1C: No results for input(s): HGBA1C in the last 72 hours. CBG: No results for input(s): GLUCAP in the last 168 hours. Lipid Profile: No results for input(s): CHOL, HDL, LDLCALC, TRIG, CHOLHDL, LDLDIRECT in the last 72 hours. Thyroid Function Tests: Recent Labs    12/09/19 1759  TSH 2.482  FREET4 1.22*   Anemia Panel: No results for input(s): VITAMINB12, FOLATE, FERRITIN, TIBC, IRON, RETICCTPCT in the last 72 hours. Urine analysis:    Component Value Date/Time   COLORURINE YELLOW (A) 12/09/2019 1123   APPEARANCEUR CLEAR (A) 12/09/2019 1123   APPEARANCEUR Clear 07/26/2016 0901   LABSPEC 1.021 12/09/2019 1123   LABSPEC 1.023 09/24/2014 1025   PHURINE 5.0 12/09/2019 1123   GLUCOSEU NEGATIVE 12/09/2019 1123   GLUCOSEU Negative 09/24/2014 1025   HGBUR NEGATIVE 12/09/2019 [place] 12/09/2019 1123   BILIRUBINUR Negative 07/26/2016 0901   BILIRUBINUR Negative 09/24/2014 1025   KETONESUR NEGATIVE 12/09/2019 1123   PROTEINUR NEGATIVE 12/09/2019 1123   NITRITE NEGATIVE 12/09/2019 1123   LEUKOCYTESUR TRACE (A) 12/09/2019 1123   LEUKOCYTESUR Trace 09/24/2014 1025    Radiological Exams on Admission: DG Chest 2 View  Result Date: 12/09/2019 CLINICAL DATA:  Short of breath, chest pain, generalized weakness EXAM: CHEST - 2 VIEW COMPARISON:  None. FINDINGS: The heart size and mediastinal contours are within normal limits. Both lungs are clear. The  visualized skeletal structures are unremarkable. IMPRESSION: No active cardiopulmonary disease. Electronically Signed   By: Randa Ngo M.D.   On: 12/09/2019 16:42   CT Head Wo Contrast  Result Date: 12/09/2019 CLINICAL DATA:  Sudden onset headache EXAM: CT HEAD WITHOUT CONTRAST TECHNIQUE: Contiguous axial images were obtained from the base of the skull through the vertex without intravenous contrast. COMPARISON:  04/29/2019 FINDINGS: Brain: There is residual contrast within the vascular structures due to previously performed CT abdomen and pelvis. This limits evaluation for hemorrhage. No signs of acute infarct or hemorrhage. The lateral ventricles and midline structures are unremarkable. No acute extra-axial fluid collections. No mass effect. Vascular: Residual contrast within the intracranial vasculature. No unexpected calcification. Skull: Normal. Negative for fracture or focal lesion. Sinuses/Orbits: No acute finding. Other: None.  The IMPRESSION: 1. Residual intravascular contrast due to previously performed CT abdomen and pelvis. 2. No acute intracranial process. Electronically Signed   By: Randa Ngo M.D.   On: 12/09/2019 18:55   CT ABDOMEN PELVIS W CONTRAST  Result Date: 12/09/2019 CLINICAL DATA:  Abdominal distension. Under antibiotic treatment for prostatitis. EXAM: CT ABDOMEN AND PELVIS WITH CONTRAST TECHNIQUE: Multidetector CT imaging of the abdomen and pelvis was performed using the standard protocol following bolus administration of intravenous contrast. CONTRAST:  135mL OMNIPAQUE IOHEXOL 300 MG/ML  SOLN COMPARISON:  CT 07/07/2019 FINDINGS: Lower chest: Lung bases are clear. Hepatobiliary: No focal hepatic lesion. Postcholecystectomy. No biliary dilatation. Pancreas: Pancreas is normal. No ductal dilatation. No pancreatic inflammation. Spleen: Normal spleen Adrenals/urinary tract: Adrenal glands and kidneys are normal. The ureters and bladder normal. Stomach/Bowel: Stomach, duodenum and  small-bowel normal. Post appendectomy. Moderate volume stool in the ascending and transverse colon. Moderate volume stool in the descending colon. There are multiple diverticula of the sigmoid colon. Rectum normal Vascular/Lymphatic: Abdominal aorta is normal caliber with atherosclerotic calcification. There is no retroperitoneal or periportal lymphadenopathy. No pelvic lymphadenopathy. Reproductive: Low-density 2.2 cm region in the anterior RIGHT aspect of the prostate gland not changed from comparison CT. Seminal vesicles are asymmetric with the LEFT gland larger than the  RIGHT. Findings are similar to CT 07/07/2019. Nodes no abscess or inflammation in the pelvis. No pelvic lymphadenopathy Other: No free fluid. Musculoskeletal: No aggressive osseous lesion. IMPRESSION: 1. No CT evidence of pelvic infection. 2. Low-density region anterior RIGHT aspect of the prostate gland and asymmetric Seminal vesicles size is similar to comparison CT 07/07/2019. 3. Sigmoid diverticulosis without diverticulitis. 4. Post appendectomy and cholecystectomy. 5. Nonobstructing calculus in the LEFT kidney. Electronically Signed   By: Suzy Bouchard M.D.   On: 12/09/2019 17:07    EKG: Independently reviewed.   Assessment/Plan Principal Problem:   Generalized weakness -Suspect infectious etiology related to recent prostatitis. -Currently on Bactrim starting on 11/29/2019 for a 40-day course of treatment, from [facility] -Per chart review, patient had recently completed empiric antibiotics for possible acute diverticulitis and also had renal colic a couple weeks prior to his presentation to [facility] -Patient had CT head CT abdomen and pelvis as well as chest x-ray with no identifiable etiology and no CT evidence of pelvic infection.  Showed diverticulosis without diverticulitis and a nonobstructing calculus in the left kidney. -The emergency room provider, Dr. Nickolas Madrid spoke with urologist, Dr. Erlene Quan who recommended getting a PSA  and outpatient follow-up -Continue Bactrim -IV hydration -Follow-up urine -Outpatient urology follow-up    AKI (acute kidney injury) ([place])   Hyperkalemia -Possibly prerenal related to poor oral intake as well as Bactrim therapy -IV hydration -Consider switching from Bactrim if no improvement -We will hold home ibuprofen and lisinopril -Patient did receive insulin and dextrose and calcium gluconate in the emergency room to treat hyperkalemia -Continue to monitor potassium    HTN (hypertension) -Holding lisinopril can resume with improved renal function in the a.m.  Psoriatic arthritis -Patient on methotrexate and Humira -   Nephrolithiasis -CT showed a nonobstructing calculus in the left kidney  Diverticulosis with history of diverticulitis -Patient was treated empirically for diverticulitis about 3 weeks prior -No evidence of acute diverticulitis on CT    DVT prophylaxis: Lovenox  Code Status: full code  Family Communication:  none  Disposition Plan: Back to previous home environment Consults called: none  Status:obs    Athena Masse MD [facility]     12/09/2019, 8:35 PM

## 2019-12-09 NOTE — ED Provider Notes (Signed)
[facility] Emergency Department Provider Note  ____________________________________________   First MD Initiated Contact with Patient 12/09/19 1543     (approximate)  I have reviewed the triage vital signs and the nursing notes.   HISTORY  Chief Complaint Weakness    HPI Jordan Hughes is a 64 y.o. male with hypertension, kidney stones who comes in with not feeling well.  Patient was seen in urgent care recently given 42 days of antibiotics for prostatitis.  I reviewed patient's records from [facility] patient had a urine concerning for UTI and a CT scan concerning for prostatitis versus carcinoma.  Patient was supposed to follow-up with urology but stated that he cannot get in till middle of July when they are frustrated about this.  They stated that he has not been acting his normal self and that he is more fatigued and tired has had 5 pounds of weight loss and not eating or drinking as well.  Patient states he just does not feel hungry and has some nausea as well.  He still able to ambulate denies any falls or focal weakness.  Denies any chest pain, shortness of breath.  Does report a little bit of abdominal tenderness and has had some recent treatments for diverticulitis as well.          Past Medical History:  Diagnosis Date  . Arthritis    joint pain  . Gout   . HOH (hard of hearing)   . Hypertension    controlled on meds  . Kidney stones     Patient Active Problem List   Diagnosis Date Noted  . Multiple kidney stones 07/09/2019  . Acute diverticulitis of intestine 07/08/2019  . HTN (hypertension) 07/07/2019  . Diverticulitis large intestine 07/07/2019  . RA (rheumatoid arthritis) ([place]) 07/07/2019  . Abdominal pain, generalized   . Abnormal weight loss   . Polyp of sigmoid colon   . Benign neoplasm of ascending colon     Past Surgical History:  Procedure Laterality Date  . abscess tonsil surgery     did not remove  .  APPENDECTOMY    . CHOLECYSTECTOMY    . COLONOSCOPY WITH PROPOFOL N/A 05/13/2017   Procedure: COLONOSCOPY WITH PROPOFOL;  Surgeon: [name], MD;  Location: [facility];  Service: Endoscopy;  Laterality: N/A;  . LITHOTRIPSY     patient states 5 times  . POLYPECTOMY N/A 05/13/2017   Procedure: POLYPECTOMY;  Surgeon: [name], MD;  Location: [place];  Service: Endoscopy;  Laterality: N/A;    Prior to Admission medications   Medication Sig Start Date End Date Taking? Authorizing Provider  Adalimumab 40 MG/0.8ML PSKT Inject 40 mg into the skin every 14 (fourteen) days.    [provider]  amoxicillin-clavulanate (AUGMENTIN) 875-125 MG tablet Take 1 tablet by mouth 2 (two) times daily. For 7 days 07/09/19   Dhungel, Flonnie Overman, MD  carbidopa-levodopa (SINEMET IR) 25-100 MG tablet Take 1 tablet by mouth 3 (three) times daily. 05/11/19 08/09/19  [provider]  folic acid (FOLVITE) 1 MG tablet Take 1 mg by mouth daily. 03/20/19   [provider]  ibuprofen (ADVIL,MOTRIN) 200 MG tablet Take 400 mg daily by mouth. Not taking    [provider]  lisinopril (ZESTRIL) 10 MG tablet Take 10 mg by mouth daily. am 04/13/16   [provider]  methotrexate (RHEUMATREX) 2.5 MG tablet Take 15 mg by mouth once a week. 06/07/19   [provider]  ondansetron (ZOFRAN ODT) 4 MG disintegrating tablet Take 1 tablet (4 mg total) by mouth every 8 (eight) hours as needed. 07/07/19   Gregor Hams, MD  tamsulosin (FLOMAX) 0.4 MG CAPS capsule Take 0.4 mg daily by mouth. am 06/17/16   [provider]    Allergies Patient has no known allergies.  Family History  Problem Relation Age of Onset  . Bladder Cancer Paternal Uncle   . Prostate cancer Neg Hx   . Kidney cancer Neg Hx     Social History Social History   Tobacco Use  . Smoking status: Never Smoker  . Smokeless tobacco: Never Used  Substance Use Topics  . Alcohol use: Yes      Alcohol/week: 10.0 standard drinks    Types: 10 Shots of liquor per week    Comment: only on Saturdays  . Drug use: No      Review of Systems Constitutional: No fever/chills positive generalized weakness Eyes: No visual changes. ENT: No sore throat. Cardiovascular: Positive chest pain Respiratory: Denies shortness of breath. Gastrointestinal: Positive abdominal pain, not eating, nausea Genitourinary: Negative for dysuria. Musculoskeletal: Negative for back pain. Skin: Negative for rash. Neurological: Negative for headaches, focal weakness or numbness. All other ROS negative ____________________________________________   PHYSICAL EXAM:  VITAL SIGNS: ED Triage Vitals  Enc Vitals Group     BP 12/09/19 1117 102/78     Pulse Rate 12/09/19 1117 88     Resp 12/09/19 1117 18     Temp 12/09/19 1117 98.9 F (37.2 C)     Temp Source 12/09/19 1117 Oral     SpO2 12/09/19 1117 98 %     Weight 12/09/19 1119 140 lb (63.5 kg)     Height 12/09/19 1119 5\' 6"  (1.676 m)     Head Circumference --      Peak Flow --      Pain Score 12/09/19 1118 0     Pain Loc --      Pain Edu? --      Excl. in [place]? --     Constitutional: Alert and oriented.  Eyes: Conjunctivae are normal. EOMI. Head: Atraumatic. Nose: No congestion/rhinnorhea. Mouth/Throat: Mucous membranes are moist.   Neck: No stridor. Trachea Midline. FROM Cardiovascular: Normal rate, regular rhythm. Grossly normal heart sounds.  Good peripheral circulation. Respiratory: Normal respiratory effort.  No retractions. Lungs CTAB. Gastrointestinal: Soft, slightly tender throughout his abdomen no distention. No abdominal bruits.  Musculoskeletal: No lower extremity tenderness nor edema.  No joint effusions. Neurologic:  Normal speech and language. No gross focal neurologic deficits are appreciated.  Equal strength in arms and legs. Skin:  Skin is warm, dry and intact. No rash noted. Psychiatric: Mood and affect are normal. Speech  and behavior are normal. GU: Deferred   ____________________________________________   LABS (all labs ordered are listed, but only abnormal results are displayed)  Labs Reviewed  URINALYSIS, COMPLETE (UACMP) WITH MICROSCOPIC - Abnormal; Notable for the following components:      Result Value   Color, Urine YELLOW (*)    APPearance CLEAR (*)    Leukocytes,Ua TRACE (*)    All other components within normal limits  CBC WITH DIFFERENTIAL/PLATELET - Abnormal; Notable for the following components:   RBC 6.23 (*)    Hemoglobin 18.6 (*)    HCT 53.0 (*)    All other components within normal limits  COMPREHENSIVE METABOLIC PANEL - Abnormal; Notable for the following components:   Sodium 132 (*)    Potassium  5.6 (*)    Glucose, Bld 103 (*)    Creatinine, Ser 1.56 (*)    Total Bilirubin 2.0 (*)    GFR calc non Af Amer 46 (*)    GFR calc Af Amer 53 (*)    All other components within normal limits  TROPONIN I (HIGH SENSITIVITY)  TROPONIN I (HIGH SENSITIVITY)   ____________________________________________   ED ECG REPORT I, Vanessa Romoland, the attending physician, personally viewed and interpreted this ECG.  EKG is sinus with a rate of 91 with occasional PAC no ST elevation inverted P wave in aVL ____________________________________________  RADIOLOGY Robert Bellow, personally viewed and evaluated these images (plain radiographs) as part of my medical decision making, as well as reviewing the written report by the radiologist.  ED MD interpretation: No pneumonia  Official radiology report(s): DG Chest 2 View  Result Date: 12/09/2019 CLINICAL DATA:  Short of breath, chest pain, generalized weakness EXAM: CHEST - 2 VIEW COMPARISON:  None. FINDINGS: The heart size and mediastinal contours are within normal limits. Both lungs are clear. The visualized skeletal structures are unremarkable. IMPRESSION: No active cardiopulmonary disease. Electronically Signed   By: Randa Ngo M.D.   On:  12/09/2019 16:42   CT ABDOMEN PELVIS W CONTRAST  Result Date: 12/09/2019 CLINICAL DATA:  Abdominal distension. Under antibiotic treatment for prostatitis. EXAM: CT ABDOMEN AND PELVIS WITH CONTRAST TECHNIQUE: Multidetector CT imaging of the abdomen and pelvis was performed using the standard protocol following bolus administration of intravenous contrast. CONTRAST:  151mL OMNIPAQUE IOHEXOL 300 MG/ML  SOLN COMPARISON:  CT 07/07/2019 FINDINGS: Lower chest: Lung bases are clear. Hepatobiliary: No focal hepatic lesion. Postcholecystectomy. No biliary dilatation. Pancreas: Pancreas is normal. No ductal dilatation. No pancreatic inflammation. Spleen: Normal spleen Adrenals/urinary tract: Adrenal glands and kidneys are normal. The ureters and bladder normal. Stomach/Bowel: Stomach, duodenum and small-bowel normal. Post appendectomy. Moderate volume stool in the ascending and transverse colon. Moderate volume stool in the descending colon. There are multiple diverticula of the sigmoid colon. Rectum normal Vascular/Lymphatic: Abdominal aorta is normal caliber with atherosclerotic calcification. There is no retroperitoneal or periportal lymphadenopathy. No pelvic lymphadenopathy. Reproductive: Low-density 2.2 cm region in the anterior RIGHT aspect of the prostate gland not changed from comparison CT. Seminal vesicles are asymmetric with the LEFT gland larger than the RIGHT. Findings are similar to CT 07/07/2019. Nodes no abscess or inflammation in the pelvis. No pelvic lymphadenopathy Other: No free fluid. Musculoskeletal: No aggressive osseous lesion. IMPRESSION: 1. No CT evidence of pelvic infection. 2. Low-density region anterior RIGHT aspect of the prostate gland and asymmetric Seminal vesicles size is similar to comparison CT 07/07/2019. 3. Sigmoid diverticulosis without diverticulitis. 4. Post appendectomy and cholecystectomy. 5. Nonobstructing calculus in the LEFT kidney. Electronically Signed   By: Suzy Bouchard  M.D.   On: 12/09/2019 17:07    ____________________________________________   PROCEDURES  Procedure(s) performed (including Critical Care):  .Critical Care Performed by: Vanessa [place], MD Authorized by: Vanessa , MD   Critical care provider statement:    Critical care time (minutes):  35   Critical care was necessary to treat or prevent imminent or life-threatening deterioration of the following conditions: AKI/hyperK requiring lowering agents    Critical care was time spent personally by me on the following activities:  Discussions with consultants, evaluation of patient's response to treatment, examination of patient, ordering and performing treatments and interventions, ordering and review of laboratory studies, ordering and review of radiographic studies, pulse oximetry, re-evaluation  of patient's condition, obtaining history from patient or surrogate and review of old charts     ____________________________________________   INITIAL IMPRESSION / ASSESSMENT AND PLAN / ED COURSE   JAVIEL CANEPA was evaluated in Emergency Department on 12/09/2019 for the symptoms described in the history of present illness. He was evaluated in the context of the global COVID-19 pandemic, which necessitated consideration that the patient might be at risk for infection with the SARS-CoV-2 virus that causes COVID-19. Institutional protocols and algorithms that pertain to the evaluation of patients at risk for COVID-19 are in a state of rapid change based on information released by regulatory bodies including the CDC and federal and state organizations. These policies and algorithms were followed during the patient's care in the ED.    Patient is a 65 year old who comes in with what sounds like failure to thrive including increased weakness, not eating. Will get labs to evaluate for Electra abnormalities, AKI, cardiac markers to evaluate for ACS of no chest pain.  Chest x-ray evaluate for mass, CT  abdomen to evaluate for abscess, SBO, perforation.  Patient currently on antibiotics for prostatitis but will get repeat urine to make sure it has been effective.  I question if patient could have prostate cancer and is having failure to thrive secondary to this.  I discussed this with family.  They are frustrated they have not be able to see a urologist until July.  I did call Dr. Caryl Pina from urology who explained that they would not do further prostate work-up including biopsy or MRI until patient had his infection adequately cleared due to the concerns for bacteremia from biopsy versus an MRI could be difficult to interpret when infection present. She recommended we get a PSA.  I also added on a CT head to make sure no evidence of intracranial mass that could be causing her nausea and vomiting and weakness.  DDx that was also considered d/t potential to cause harm, but was found less likely based on history and physical (as detailed above): -PNA (no fevers, cough but CXR to evaluate) -PNX (reassured with equal b/l breath sounds, CXR to evaluate) -Symptomatic anemia (will get H&H) -Pulmonary embolism as no sob at rest, not pleuritic in nature, no hypoxia -Aortic Dissection as no tearing pain and no radiation to the mid back, pulses equal -Pericarditis no rub on exam, EKG changes or hx to suggest dx -Tamponade (no notable SOB, tachycardic, hypotensive) -Esophageal rupture (no h/o diffuse vomitting/no crepitus)  Labs are reassuring with no evidence of UTI Hemoglobin slightly elevated at 18.6 but no evidence of anemia No evidence of infection with a normal white count Kidney function slightly elevated at 1.56 with a potassium of 5.6 which is higher than baseline at 1.  No peak T waves on EKG T bili slightly elevated but similar to previous Initial troponin was 4 and repeat was 5 therefore ruled out  Patient given 1 L of fluids, Zofran.  Patient able to ambulate to the bathroom without  assistance.  Discussed with family that we could admit him for symptomatic management including fluids, Zofran, monitoring his kidney function but it would not expedite his work-up for prostate cancer.  They would like to see how patient does and get repeat labs.  CT head was negative.  Patient able to ambulate able to drink a few sips of ginger ale otherwise not able to eat anything.  Repeat BMP shows a creatinine of 1.45 which is downtrending however his potassium is 6.2.  No mention of hemolysis.  EKG potentially some peak T waves so we will be cautious to give some calcium gluconate as well as some lowers including bicarb insulin dextrose and another 1 L of fluids.  Will get a bladder scan to make sure that he is not retaining that could be causing his elevated kidney function.  Given his continued hyperkalemia in the setting of his failure to thrive symptoms will discuss with hospital team for admission      ____________________________________________   FINAL CLINICAL IMPRESSION(S) / ED DIAGNOSES   Final diagnoses:  AKI (acute kidney injury) ([place])  Hyperkalemia  Failure to thrive in adult  Weakness     MEDICATIONS GIVEN DURING THIS VISIT:  Medications  insulin aspart (novoLOG) injection 5 Units (has no administration in time range)    And  dextrose 50 % solution 50 mL (has no administration in time range)  sodium bicarbonate injection 50 mEq (has no administration in time range)  calcium gluconate 1 g/ 50 mL sodium chloride IVPB (has no administration in time range)  sodium chloride 0.9 % bolus 1,000 mL (has no administration in time range)  sodium chloride 0.9 % bolus 1,000 mL (0 mLs Intravenous Stopped 12/09/19 1759)  ondansetron (ZOFRAN-ODT) disintegrating tablet 4 mg (4 mg Oral Given 12/09/19 1623)  iohexol (OMNIPAQUE) 300 MG/ML solution 100 mL (100 mLs Intravenous Contrast Given 12/09/19 1633)     ED Discharge Orders    None       Note:  This document was prepared  using Dragon voice recognition software and may include unintentional dictation errors.   Vanessa Elbe, MD 12/09/19 Karl Bales

## 2019-12-09 NOTE — ED Triage Notes (Signed)
Pt here for "feeling bad".  Was seen at [facility] couple weeks ago and given 42 days abx for prostatitis.  Pt reports not getting better.  C/o nausea and generalized weakness.  Unlabored. VSS in triage.  No bowel movement in 2 days. No pain per pt.

## 2019-12-10 DIAGNOSIS — N179 Acute kidney failure, unspecified: Principal | ICD-10-CM

## 2019-12-10 DIAGNOSIS — E875 Hyperkalemia: Secondary | ICD-10-CM | POA: Diagnosis not present

## 2019-12-10 DIAGNOSIS — N419 Inflammatory disease of prostate, unspecified: Secondary | ICD-10-CM | POA: Diagnosis not present

## 2019-12-10 DIAGNOSIS — R531 Weakness: Secondary | ICD-10-CM | POA: Diagnosis not present

## 2019-12-10 DIAGNOSIS — L4050 Arthropathic psoriasis, unspecified: Secondary | ICD-10-CM

## 2019-12-10 DIAGNOSIS — I10 Essential (primary) hypertension: Secondary | ICD-10-CM

## 2019-12-10 LAB — BASIC METABOLIC PANEL
Anion gap: 7 (ref 5–15)
BUN: 18 mg/dL (ref 8–23)
CO2: 23 mmol/L (ref 22–32)
Calcium: 8.7 mg/dL — ABNORMAL LOW (ref 8.9–10.3)
Chloride: 104 mmol/L (ref 98–111)
Creatinine, Ser: 1.19 mg/dL (ref 0.61–1.24)
GFR calc Af Amer: >60 mL/min (ref >60)
GFR calc non Af Amer: >60 mL/min (ref >60)
Glucose, Bld: 84 mg/dL (ref 70–99)
Potassium: 4.9 mmol/L (ref 3.5–5.1)
Sodium: 134 mmol/L — ABNORMAL LOW (ref 135–145)

## 2019-12-10 LAB — CBC
HCT: 44.3 % (ref 39.0–52.0)
Hemoglobin: 15.8 g/dL (ref 13.0–17.0)
MCH: 30.2 pg (ref 26.0–34.0)
MCHC: 35.7 g/dL (ref 30.0–36.0)
MCV: 84.5 fL (ref 80.0–100.0)
Platelets: 211 10*3/uL (ref 150–400)
RBC: 5.24 MIL/uL (ref 4.22–5.81)
RDW: 13.1 % (ref 11.5–15.5)
WBC: 5.4 10*3/uL (ref 4.0–10.5)
nRBC: 0 % (ref 0.0–0.2)

## 2019-12-10 MED ORDER — ONDANSETRON HCL 4 MG PO TABS: 4.0000 mg | ORAL_TABLET | Freq: Four times a day (QID) | ORAL | 0 refills | Status: DC | PRN

## 2019-12-10 MED ORDER — CIPROFLOXACIN HCL 500 MG PO TABS: 500.0000 mg | ORAL_TABLET | Freq: Two times a day (BID) | ORAL | 0 refills | Status: DC

## 2019-12-10 MED ORDER — SULFAMETHOXAZOLE-TRIMETHOPRIM 400-80 MG PO TABS
1.0000 | ORAL_TABLET | Freq: Two times a day (BID) | ORAL | Status: DC
  Administered 2019-12-10 (×2): 1 via ORAL
  Filled 2019-12-10 (×3): qty 1

## 2019-12-10 MED ORDER — TAMSULOSIN HCL 0.4 MG PO CAPS: 0.4000 mg | ORAL_CAPSULE | Freq: Every day | ORAL | Status: DC

## 2019-12-10 MED ORDER — CIPROFLOXACIN HCL 500 MG PO TABS
500.0000 mg | ORAL_TABLET | Freq: Two times a day (BID) | ORAL | Status: DC
  Administered 2019-12-10: 500 mg via ORAL
  Filled 2019-12-10: qty 1

## 2019-12-10 MED ORDER — TAMSULOSIN HCL 0.4 MG PO CAPS
0.4000 mg | ORAL_CAPSULE | Freq: Every day | ORAL | Status: DC
  Administered 2019-12-10: 0.4 mg via ORAL
  Filled 2019-12-10: qty 1

## 2019-12-10 NOTE — Discharge Summary (Signed)
[facility] NAME: Jordan Hughes    MR#:  [id number]  DATE OF BIRTH:  1955/08/10  DATE OF ADMISSION:  12/09/2019 ADMITTING PHYSICIAN: Athena Masse, MD  DATE OF DISCHARGE: 12/10/2019 12:47 PM  PRIMARY CARE PHYSICIAN: Lynnell Jude, MD    ADMISSION DIAGNOSIS:  AKI (acute kidney injury) ([place]) [N17.9]  DISCHARGE DIAGNOSIS:  Principal Problem:   Generalized weakness Active Problems:   HTN (hypertension)   RA (rheumatoid arthritis) ([place])   Nephrolithiasis   AKI (acute kidney injury) ([place])   Hyperkalemia   SECONDARY DIAGNOSIS:   Past Medical History:  Diagnosis Date  . Arthritis    joint pain  . Gout   . HOH (hard of hearing)   . Hypertension    controlled on meds  . Kidney stones     HOSPITAL COURSE:   1.  Acute kidney injury and hyperkalemia.  Likely secondary to Bactrim and lisinopril.  Both of these medications are held.  Patient given IV fluids with improvement in kidney function and potassium. 2.  Generalized weakness.  Improved today from yesterday likely combination of electrolyte abnormalities and dehydration and acute kidney injury.  Patient feeling well enough to go home today. 3.  Prostatitis with trouble getting his urine out on Flomax.  Change antibiotics over to Cipro.  Refer to urology as outpatient.  PSA 1.4. 4.  Psoriatic arthritis.  Hold Humira while on antibiotics.  Okay to skip 1 week of methotrexate and then can go back on it. 5.  Essential hypertension blood pressure stable off medication 6.  History of kidney stones    DISCHARGE CONDITIONS:   Satisfactory  CONSULTS OBTAINED:  None  DRUG ALLERGIES:  No Known Allergies  DISCHARGE MEDICATIONS:   Allergies as of 12/10/2019   No Known Allergies     Medication List    STOP taking these medications   Adalimumab 40 MG/0.8ML Pskt   lisinopril 10 MG tablet Commonly known as: ZESTRIL   sulfamethoxazole-trimethoprim 800-160 MG  tablet Commonly known as: BACTRIM DS     TAKE these medications   ciprofloxacin 500 MG tablet Commonly known as: CIPRO Take 1 tablet (500 mg total) by mouth 2 (two) times daily.   folic acid 1 MG tablet Commonly known as: FOLVITE Take 1 mg by mouth daily.   ibuprofen 200 MG tablet Commonly known as: ADVIL Take 400 mg daily by mouth. Not taking   methotrexate 2.5 MG tablet Commonly known as: RHEUMATREX Take 15 mg by mouth once a week.   ondansetron 4 MG tablet Commonly known as: ZOFRAN Take 1 tablet (4 mg total) by mouth every 6 (six) hours as needed for nausea.   tamsulosin 0.4 MG Caps capsule Commonly known as: FLOMAX Take 0.4 mg daily by mouth. am        DISCHARGE INSTRUCTIONS:   Follow-up PMD 5 days Follow-up urology 1 week  If you experience worsening of your admission symptoms, develop shortness of breath, life threatening emergency, suicidal or homicidal thoughts you must seek medical attention immediately by calling 911 or calling your MD immediately  if symptoms less severe.  You Must read complete instructions/literature along with all the possible adverse reactions/side effects for all the Medicines you take and that have been prescribed to you. Take any new Medicines after you have completely understood and accept all the possible adverse reactions/side effects.   Please note  You were cared for by a hospitalist during your hospital stay. If  you have any questions about your discharge medications or the care you received while you were in the hospital after you are discharged, you can call the unit and asked to speak with the hospitalist on call if the hospitalist that took care of you is not available. Once you are discharged, your primary care physician will handle any further medical issues. Please note that NO REFILLS for any discharge medications will be authorized once you are discharged, as it is imperative that you return to your primary care physician  (or establish a relationship with a primary care physician if you do not have one) for your aftercare needs so that they can reassess your need for medications and monitor your lab values.    Today   CHIEF COMPLAINT:   Chief Complaint  Patient presents with  . Weakness    HISTORY OF PRESENT ILLNESS:  Jordan Hughes  is a 64 y.o. male came in with weakness   VITAL SIGNS:  Blood pressure 133/79, pulse 67, temperature 98.6 F (37 C), resp. rate 16, height 5\' 6"  (1.676 m), weight 63.5 kg, SpO2 100 %.  I/O:    Intake/Output Summary (Last 24 hours) at 12/10/2019 1512 Last data filed at 12/10/2019 0509 Gross per 24 hour  Intake --  Output 400 ml  Net -400 ml    PHYSICAL EXAMINATION:  GENERAL:  64 y.o.-year-old patient lying in the bed with no acute distress.  EYES: Pupils equal, round, reactive to light and accommodation. No scleral icterus. Extraocular muscles intact.  HEENT: Head atraumatic, normocephalic. Oropharynx and nasopharynx clear.  LUNGS: Normal breath sounds bilaterally, no wheezing, rales,rhonchi or crepitation. No use of accessory muscles of respiration.  CARDIOVASCULAR: S1, S2 normal. No murmurs, rubs, or gallops.  ABDOMEN: Soft, slight tender almost in the left groin area. non-distended. Bowel sounds present. No organomegaly or mass.  EXTREMITIES: No pedal edema, cyanosis, or clubbing.  NEUROLOGIC: Cranial nerves II through XII are intact. Muscle strength 5/5 in all extremities. Sensation intact. Gait not checked.  PSYCHIATRIC: The patient is alert and oriented x 3.  SKIN: No obvious rash, lesion, or ulcer.   DATA REVIEW:   CBC Recent Labs  Lab 12/10/19 0459  WBC 5.4  HGB 15.8  HCT 44.3  PLT 211    Chemistries  Recent Labs  Lab 12/09/19 1123 12/09/19 1759 12/10/19 0459  NA 132*   < > 134*  K 5.6*   < > 4.9  CL 99   < > 104  CO2 23   < > 23  GLUCOSE 103*   < > 84  BUN 22   < > 18  CREATININE 1.56*   < > 1.19  CALCIUM 9.8   < > 8.7*  AST 21   --   --   ALT 29  --   --   ALKPHOS 73  --   --   BILITOT 2.0*  --   --    < > = values in this interval not displayed.     Microbiology Results  Results for orders placed or performed during the hospital encounter of 12/09/19  SARS Coronavirus 2 by RT PCR (hospital order, performed in [facility] hospital lab) Nasopharyngeal Nasopharyngeal Swab     Status: None   Collection Time: 12/09/19  5:59 PM   Specimen: Nasopharyngeal Swab  Result Value Ref Range Status   SARS Coronavirus 2 NEGATIVE NEGATIVE Final    Comment: (NOTE) SARS-CoV-2 target nucleic acids are NOT DETECTED. The SARS-CoV-2 RNA  is generally detectable in upper and lower respiratory specimens during the acute phase of infection. The lowest concentration of SARS-CoV-2 viral copies this assay can detect is 250 copies / mL. A negative result does not preclude SARS-CoV-2 infection and should not be used as the sole basis for treatment or other patient management decisions.  A negative result may occur with improper specimen collection / handling, submission of specimen other than nasopharyngeal swab, presence of viral mutation(s) within the areas targeted by this assay, and inadequate number of viral copies (<250 copies / mL). A negative result must be combined with clinical observations, patient history, and epidemiological information. Fact Sheet for Patients:   StrictlyIdeas.no Fact Sheet for Healthcare Providers: BankingDealers.co.za This test is not yet approved or cleared  by the [place] FDA and has been authorized for detection and/or diagnosis of SARS-CoV-2 by FDA under an Emergency Use Authorization (EUA).  This EUA will remain in effect (meaning this test can be used) for the duration of the COVID-19 declaration under Section 564(b)(1) of the Act, 21 U.S.C. section 360bbb-3(b)(1), unless the authorization is terminated or revoked sooner. Performed at  [facility], [address] [place], [place] [address]     RADIOLOGY:  DG Chest 2 View  Result Date: 12/09/2019 CLINICAL DATA:  Short of breath, chest pain, generalized weakness EXAM: CHEST - 2 VIEW COMPARISON:  None. FINDINGS: The heart size and mediastinal contours are within normal limits. Both lungs are clear. The visualized skeletal structures are unremarkable. IMPRESSION: No active cardiopulmonary disease. Electronically Signed   By: Randa Ngo M.D.   On: 12/09/2019 16:42   CT Head Wo Contrast  Result Date: 12/09/2019 CLINICAL DATA:  Sudden onset headache EXAM: CT HEAD WITHOUT CONTRAST TECHNIQUE: Contiguous axial images were obtained from the base of the skull through the vertex without intravenous contrast. COMPARISON:  04/29/2019 FINDINGS: Brain: There is residual contrast within the vascular structures due to previously performed CT abdomen and pelvis. This limits evaluation for hemorrhage. No signs of acute infarct or hemorrhage. The lateral ventricles and midline structures are unremarkable. No acute extra-axial fluid collections. No mass effect. Vascular: Residual contrast within the intracranial vasculature. No unexpected calcification. Skull: Normal. Negative for fracture or focal lesion. Sinuses/Orbits: No acute finding. Other: None.  The IMPRESSION: 1. Residual intravascular contrast due to previously performed CT abdomen and pelvis. 2. No acute intracranial process. Electronically Signed   By: Randa Ngo M.D.   On: 12/09/2019 18:55   CT ABDOMEN PELVIS W CONTRAST  Result Date: 12/09/2019 CLINICAL DATA:  Abdominal distension. Under antibiotic treatment for prostatitis. EXAM: CT ABDOMEN AND PELVIS WITH CONTRAST TECHNIQUE: Multidetector CT imaging of the abdomen and pelvis was performed using the standard protocol following bolus administration of intravenous contrast. CONTRAST:  132mL OMNIPAQUE IOHEXOL 300 MG/ML  [facility] COMPARISON:  CT 07/07/2019 FINDINGS: Lower chest:  Lung bases are clear. Hepatobiliary: No focal hepatic lesion. Postcholecystectomy. No biliary dilatation. Pancreas: Pancreas is normal. No ductal dilatation. No pancreatic inflammation. Spleen: Normal spleen Adrenals/urinary tract: Adrenal glands and kidneys are normal. The ureters and bladder normal. Stomach/Bowel: Stomach, duodenum and small-bowel normal. Post appendectomy. Moderate volume stool in the ascending and transverse colon. Moderate volume stool in the descending colon. There are multiple diverticula of the sigmoid colon. Rectum normal Vascular/Lymphatic: Abdominal aorta is normal caliber with atherosclerotic calcification. There is no retroperitoneal or periportal lymphadenopathy. No pelvic lymphadenopathy. Reproductive: Low-density 2.2 cm region in the anterior RIGHT aspect of the prostate gland not changed from comparison CT.  Seminal vesicles are asymmetric with the LEFT gland larger than the RIGHT. Findings are similar to CT 07/07/2019. Nodes no abscess or inflammation in the pelvis. No pelvic lymphadenopathy Other: No free fluid. Musculoskeletal: No aggressive osseous lesion. IMPRESSION: 1. No CT evidence of pelvic infection. 2. Low-density region anterior RIGHT aspect of the prostate gland and asymmetric Seminal vesicles size is similar to comparison CT 07/07/2019. 3. Sigmoid diverticulosis without diverticulitis. 4. Post appendectomy and cholecystectomy. 5. Nonobstructing calculus in the LEFT kidney. Electronically Signed   By: Suzy Bouchard M.D.   On: 12/09/2019 17:07    Management plans discussed with the patient, family and they are in agreement.  CODE STATUS:     Code Status Orders  (From admission, onward)         Start     Ordered   12/09/19 2033  Full code  Continuous        12/09/19 2035        Code Status History    Date Active Date Inactive Code Status Order ID Comments User Context   07/07/2019 2246 07/09/2019 1722 Full Code [id number]  Ivor Costa, MD Inpatient    Advance Care Planning Activity      TOTAL TIME TAKING CARE OF THIS PATIENT: 35 minutes.    Loletha Grayer M.D on 12/10/2019 at 3:12 PM  Between 7am to 6pm - Pager - [phone]  After 6pm go to [email or web address] - password [email or web address]  [facility]  CC: Primary care physician; Lynnell Jude, MD

## 2019-12-10 NOTE — ED Notes (Signed)
Pt given popsicle at this time

## 2019-12-29 ENCOUNTER — Ambulatory Visit: Payer: Medicare Other | Admitting: Urology

## 2019-12-29 ENCOUNTER — Other Ambulatory Visit: Payer: Self-pay

## 2019-12-29 ENCOUNTER — Encounter: Payer: Self-pay | Admitting: Urology

## 2019-12-29 VITALS — BP 123/82 | HR 55 | Ht 66.0 in | Wt 144.0 lb

## 2019-12-29 DIAGNOSIS — N138 Other obstructive and reflux uropathy: Secondary | ICD-10-CM

## 2019-12-29 DIAGNOSIS — N410 Acute prostatitis: Secondary | ICD-10-CM

## 2019-12-29 DIAGNOSIS — N4283 Cyst of prostate: Secondary | ICD-10-CM | POA: Diagnosis not present

## 2019-12-29 DIAGNOSIS — N401 Enlarged prostate with lower urinary tract symptoms: Secondary | ICD-10-CM

## 2019-12-29 NOTE — Progress Notes (Signed)
12/29/19 10:46 AM   Jordan Hughes 21-Apr-1956 [id number]  CC: Prostatitis, prostate cyst  HPI: I saw Jordan Hughes in urology clinic today for the above issues.  He is a 64 year old male with a history of extensive nephrolithiasis requiring 5-6 lithotripsies in the past who was recently treated for prostatitis.  He was originally seen at [facility] on 11/29/2019 with low pelvic pain and difficulty urinating.  Urinalysis showed 3 WBCs, no RBCs, no bacteria, no leukocytes, was nitrite positive, and he was started on Bactrim.  Urine culture was ultimately negative.  CT at that time showed an hypoechoic lesion in the right prostate that was indeterminate in nature, but unchanged from CT in January 2021.  He had worsening of nausea and abdominal pain on Bactrim and was then seen at [facility] on 12/09/2019.  PSA was normal at 1.4, and urinalysis was completely benign.  Another CT was performed that re- demonstrated the low-density region in the anterior right aspect of the prostate that was unchanged from January 2021.  There is no hydronephrosis.  There was a small nonobstructing stone in the left kidney.  He was changed to Cipro at that visit, and he reports his lower abdominal pain and urination has improved significantly on this medication.  He feels he is almost returned to normal.  He denies any gross hematuria, fevers, or chills.  He denies any family history of prostate cancer.  He has been on Flomax long-term for weak stream and BPH symptoms.  He reports 50 pounds of weight loss over the last 5 years, and 5 pounds of weight loss in the last 6 to 12 months.  He denies any bone pain.  IPSS score today is 16 with quality of life mixed.  Prostate measures 42g on CT.  PMH: Past Medical History:  Diagnosis Date  . Arthritis    joint pain  . Gout   . HOH (hard of hearing)   . Hypertension    controlled on meds  . Kidney stones     Surgical History: Past Surgical History:  Procedure  Laterality Date  . abscess tonsil surgery     did not remove  . APPENDECTOMY    . CHOLECYSTECTOMY    . COLONOSCOPY WITH PROPOFOL N/A 05/13/2017   Procedure: COLONOSCOPY WITH PROPOFOL;  Surgeon: [name], MD;  Location: [facility];  Service: Endoscopy;  Laterality: N/A;  . LITHOTRIPSY     patient states 5 times  . POLYPECTOMY N/A 05/13/2017   Procedure: POLYPECTOMY;  Surgeon: [name], MD;  Location: [facility];  Service: Endoscopy;  Laterality: N/A;    Family History: Family History  Problem Relation Age of Onset  . Bladder Cancer Paternal Uncle   . Prostate cancer Neg Hx   . Kidney cancer Neg Hx     Social History:  reports that he has never smoked. He has never used smokeless tobacco. He reports current alcohol use of about 10.0 standard drinks of alcohol per week. He reports that he does not use drugs.  Physical Exam: BP 123/82   Pulse (!) 55   Ht 5\' 6"  (1.676 m)   Wt 144 lb (65.3 kg)   BMI 23.24 kg/m    Constitutional:  Alert and oriented, No acute distress. Cardiovascular: No clubbing, cyanosis, or edema. Respiratory: Normal respiratory effort, no increased work of breathing. GI: Abdomen is soft, nontender, nondistended, no abdominal masses GU: No CVA tenderness, phallus without lesions,*widely patent meatus DRE: Prominent right lobe of the  prostate consistent with CT findings  Laboratory Data: Reviewed, see HPI  Pertinent Imaging: I have personally reviewed the CT from 5/30, 6/9, and prior CT from January 2021, as well as CT stone protocol from January 2018.  Right-sided cystic lesion in the prostate has become more prominent since 2018, but is stable from January 2021.  No lymphadenopathy.  Assessment & Plan:   In summary, is a 64 year old male with a long history of BPH symptoms and recurrent kidney stones who was treated for possible prostatitis in early June 2021 with Cipro and improvement in his urinary symptoms and pelvic pain.  PSA  is normal at 1.4.  CT shows a 45 g prostate with a prominent cystic lesion on the right anterior portion of the prostate, which correlates with DRE findings today.  We had a long conversation about his BPH and prostatitis, etiologies, and other treatment options including consideration of an outlet procedure in the future.  We also discussed the need for further evaluation of his abnormal prostate CT findings and DRE, and I recommended a prostate MRI.  We discussed the need for a prostate biopsy in the future if prostate MRI showed any findings worrisome for malignancy.  Complete course of Cipro for prostatitis Discussed adequate hydration, timed voiding, and cranberry tablets for prostatitis prophylaxis Prostate MRI for better evaluation of CT lesion, rule out malignancy, call with results Pursue prostate biopsy pending MRI findings  Jordan Madrid, MD 12/29/2019  [facility] [address] [place], [place] [address] [phone]

## 2019-12-29 NOTE — Patient Instructions (Signed)

## 2020-02-08 ENCOUNTER — Other Ambulatory Visit: Payer: Self-pay

## 2020-02-08 ENCOUNTER — Ambulatory Visit
Admission: RE | Admit: 2020-02-08 | Discharge: 2020-02-08 | Disposition: A | Discharge status: Home / Self Care | Payer: Medicare Other | Source: Ambulatory Visit | Attending: Urology | Admitting: Urology

## 2020-02-08 DIAGNOSIS — N4283 Cyst of prostate: Secondary | ICD-10-CM | POA: Diagnosis not present

## 2020-02-08 MED ORDER — GADOBUTROL 1 MMOL/ML IV SOLN
6.0000 mL | Freq: Once | INTRAVENOUS | Status: AC | PRN
  Administered 2020-02-08: 6 mL via INTRAVENOUS

## 2020-02-09 ENCOUNTER — Telehealth: Payer: Self-pay

## 2020-02-09 NOTE — Telephone Encounter (Signed)
Called pt informed him of the information below. Pt gave verbal understanding. Appt scheduled.

## 2020-02-09 NOTE — Telephone Encounter (Signed)
-----   Message from Billey Co, MD sent at 02/09/2020  8:18 AM EDT ----- No worrisome findings on prostate MRI, just a cyst no evidence of cancer. Please schedule follow up in 6 mo for symptom check with IPSS/PVR, thanks!  Nickolas Madrid, MD 02/09/2020

## 2020-08-11 ENCOUNTER — Ambulatory Visit: Payer: Self-pay | Admitting: Urology

## 2020-08-23 ENCOUNTER — Encounter: Payer: Self-pay | Admitting: Urology

## 2020-08-23 ENCOUNTER — Ambulatory Visit: Payer: Medicare Other | Admitting: Urology

## 2020-08-23 ENCOUNTER — Other Ambulatory Visit: Payer: Self-pay

## 2020-08-23 VITALS — BP 162/93 | HR 52 | Ht 66.0 in | Wt 147.0 lb

## 2020-08-23 DIAGNOSIS — N4283 Cyst of prostate: Secondary | ICD-10-CM | POA: Diagnosis not present

## 2020-08-23 DIAGNOSIS — N200 Calculus of kidney: Secondary | ICD-10-CM | POA: Diagnosis not present

## 2020-08-23 DIAGNOSIS — N529 Male erectile dysfunction, unspecified: Secondary | ICD-10-CM | POA: Diagnosis not present

## 2020-08-23 DIAGNOSIS — N401 Enlarged prostate with lower urinary tract symptoms: Secondary | ICD-10-CM

## 2020-08-23 DIAGNOSIS — N138 Other obstructive and reflux uropathy: Secondary | ICD-10-CM

## 2020-08-23 LAB — BLADDER SCAN AMB NON-IMAGING: Scan Result: 112

## 2020-08-23 MED ORDER — TADALAFIL 5 MG PO TABS: 5.0000 mg | ORAL_TABLET | Freq: Every day | ORAL | 6 refills | Status: DC | PRN

## 2020-08-23 NOTE — Progress Notes (Signed)
   08/23/2020 3:12 PM   Jordan Hughes 03-25-1956 [id number]  Reason for visit: BPH, prostate cyst, ED, nephrolithiasis  HPI: I saw Jordan Hughes in clinic for the above issues.  Is a 65 year old male with extensive history of nephrolithiasis, as well as recent episode of prostatitis in May 2021.  He ultimately was treated with Cipro with improvement in his symptoms.  He also had a CT at that time that showed a hypoechoic lesion in the right prostate that was indeterminate, but stable from a prior CT in January 2021.  PSA was normal at 1.4.  We opted to pursue a prostate MRI with the abnormal imaging findings and recent prostatitis.  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  IPSS score today is 17, with quality of life mixed.  He has been on Flomax long-term.  He is interested in discontinuing the Flomax to see if his urinary symptoms change at all, as he is not convinced this is helping him.  I think this is very reasonable, and we discussed reasons to resume the Flomax including weak stream or inability to urinate.  He seems to be minimally bothered by his urinary symptoms at this time.  We discussed consideration of cystoscopy in the future if he had worsening urinary symptoms.  He also reports trouble with erections over the last year months and is interested in trying medications for this.  He is never tried these before.  We discussed the differences between Viagra and Cialis, and he opted for a trial of generic Cialis on demand.  Risks and benefits discussed.  Trial of Cialis 5 to 10 mg on demand for ED RTC 1 year for PSA, IPSS, PVR  Billey Co, MD  [facility] [address] [place], [place] [address] [phone]

## 2020-08-23 NOTE — Patient Instructions (Signed)
Tadalafil tablets (Cialis) What is this medicine? TADALAFIL (tah DA la fil) is used to treat erection problems in men. It is also used for enlargement of the prostate gland in men, a condition called benign prostatic hyperplasia or BPH. This medicine improves urine flow and reduces BPH symptoms. This medicine can also treat both erection problems and BPH when they occur together. This medicine may be used for other purposes; ask your health care provider or pharmacist if you have questions. COMMON BRAND NAME(S): Adcirca, ALYQ, Cialis What should I tell my health care provider before I take this medicine? They need to know if you have any of these conditions:  bleeding disorders  eye or vision problems, including a rare inherited eye disease called retinitis pigmentosa  anatomical deformation of the penis, Peyronie's disease, or history of priapism (painful and prolonged erection)  heart disease, angina, a history of heart attack, irregular heart beats, or other heart problems  high or low blood pressure  history of blood diseases, like sickle cell anemia or leukemia  history of stomach bleeding  kidney disease  liver disease  stroke  an unusual or allergic reaction to tadalafil, other medicines, foods, dyes, or preservatives  pregnant or trying to get pregnant  breast-feeding How should I use this medicine? Take this medicine by mouth with a glass of water. Follow the directions on the prescription label. You may take this medicine with or without meals. When this medicine is used for erection problems, your doctor may prescribe it to be taken once daily or as needed. If you are taking the medicine as needed, you may be able to have sexual activity 30 minutes after taking it and for up to 36 hours after taking it. Whether you are taking the medicine as needed or once daily, you should not take more than one dose per day. If you are taking this medicine for symptoms of benign  prostatic hyperplasia (BPH) or to treat both BPH and an erection problem, take the dose once daily at about the same time each day. Do not take your medicine more often than directed. Talk to your pediatrician regarding the use of this medicine in children. Special care may be needed. Overdosage: If you think you have taken too much of this medicine contact a poison control center or emergency room at once. NOTE: This medicine is only for you. Do not share this medicine with others. What if I miss a dose? If you are taking this medicine as needed for erection problems, this does not apply. If you miss a dose while taking this medicine once daily for an erection problem, benign prostatic hyperplasia, or both, take it as soon as you remember, but do not take more than one dose per day. What may interact with this medicine? Do not take this medicine with any of the following medications:  nitrates like amyl nitrite, isosorbide dinitrate, isosorbide mononitrate, nitroglycerin  other medicines for erectile dysfunction like avanafil, sildenafil, vardenafil  other tadalafil products (Adcirca)  riociguat This medicine may also interact with the following medications:  certain drugs for high blood pressure  certain drugs for the treatment of HIV infection or AIDS  certain drugs used for fungal or yeast infections, like fluconazole, itraconazole, ketoconazole, and voriconazole  certain drugs used for seizures like carbamazepine, phenytoin, and phenobarbital  grapefruit juice  macrolide antibiotics like clarithromycin, erythromycin, troleandomycin  medicines for prostate problems  rifabutin, rifampin or rifapentine This list may not describe all possible interactions. Give your   or use illegal drugs. Some items may interact with your medicine. What should I watch for while using this medicine? If  you notice any changes in your vision while taking this drug, call your doctor or health care professional as soon as possible. Stop using this medicine and call your health care provider right away if you have a loss of sight in one or both eyes. Contact your doctor or health care professional right away if the erection lasts longer than 4 hours or if it becomes painful. This may be a sign of serious problem and must be treated right away to prevent permanent damage. If you experience symptoms of nausea, dizziness, chest pain or arm pain upon initiation of sexual activity after taking this medicine, you should refrain from further activity and call your doctor or health care professional as soon as possible. Do not drink alcohol to excess (examples, 5 glasses of wine or 5 shots of whiskey) when taking this medicine. When taken in excess, alcohol can increase your chances of getting a headache or getting dizzy, increasing your heart rate or lowering your blood pressure. Using this medicine does not protect you or your partner against HIV infection (the virus that causes AIDS) or other sexually transmitted diseases. What side effects may I notice from receiving this medicine? Side effects that you should report to your doctor or health care professional as soon as possible: allergic reactions like skin rash, itching or hives, swelling of the face, lips, or tongue breathing problems changes in hearing changes in vision chest pain fast, irregular heartbeat prolonged or painful erection seizures Side effects that usually do not require medical attention (report to your doctor or health care professional if they continue or are bothersome): back pain dizziness flushing headache indigestion muscle aches nausea stuffy or runny nose This list may not describe all possible side effects. Call your doctor for medical advice about side effects. You may report side effects to FDA at 1-800-FDA-1088. Where  should I keep my medicine? Keep out of the reach of children. Store at room temperature between 15 and 30 degrees C (59 and 86 degrees F). Throw away any unused medicine after the expiration date. NOTE: This sheet is a summary. It may not cover all possible information. If you have questions about this medicine, talk to your doctor, pharmacist, or health care provider.  2021 Elsevier/Gold Standard (2013-11-06 13:15:49)  

## 2020-12-11 ENCOUNTER — Other Ambulatory Visit: Payer: Self-pay

## 2020-12-11 ENCOUNTER — Emergency Department
Admission: EM | Admit: 2020-12-11 | Discharge: 2020-12-11 | Disposition: A | Discharge status: Home / Self Care | Payer: Medicare Other | Attending: Emergency Medicine | Admitting: Emergency Medicine

## 2020-12-11 ENCOUNTER — Emergency Department: Payer: Medicare Other

## 2020-12-11 DIAGNOSIS — I10 Essential (primary) hypertension: Secondary | ICD-10-CM | POA: Diagnosis not present

## 2020-12-11 DIAGNOSIS — Z87442 Personal history of urinary calculi: Secondary | ICD-10-CM | POA: Diagnosis not present

## 2020-12-11 DIAGNOSIS — Z79899 Other long term (current) drug therapy: Secondary | ICD-10-CM | POA: Diagnosis not present

## 2020-12-11 DIAGNOSIS — R109 Unspecified abdominal pain: Secondary | ICD-10-CM | POA: Diagnosis present

## 2020-12-11 DIAGNOSIS — R110 Nausea: Secondary | ICD-10-CM | POA: Insufficient documentation

## 2020-12-11 LAB — BASIC METABOLIC PANEL
Anion gap: 6 (ref 5–15)
BUN: 9 mg/dL (ref 8–23)
CO2: 29 mmol/L (ref 22–32)
Calcium: 9.1 mg/dL (ref 8.9–10.3)
Chloride: 106 mmol/L (ref 98–111)
Creatinine, Ser: 1.19 mg/dL (ref 0.61–1.24)
GFR, Estimated: >60 mL/min (ref >60)
Glucose, Bld: 116 mg/dL — ABNORMAL HIGH (ref 70–99)
Potassium: 3.7 mmol/L (ref 3.5–5.1)
Sodium: 141 mmol/L (ref 135–145)

## 2020-12-11 LAB — LIPASE, BLOOD: Lipase: 48 U/L (ref 11–51)

## 2020-12-11 LAB — HEPATIC FUNCTION PANEL
ALT: 19 U/L (ref 0–44)
AST: 20 U/L (ref 15–41)
Albumin: 4.2 g/dL (ref 3.5–5.0)
Alkaline Phosphatase: 63 U/L (ref 38–126)
Bilirubin, Direct: 0.1 mg/dL (ref 0.0–0.2)
Indirect Bilirubin: 1.2 mg/dL — ABNORMAL HIGH (ref 0.3–0.9)
Total Bilirubin: 1.3 mg/dL — ABNORMAL HIGH (ref 0.3–1.2)
Total Protein: 7 g/dL (ref 6.5–8.1)

## 2020-12-11 LAB — URINALYSIS, COMPLETE (UACMP) WITH MICROSCOPIC
Bilirubin Urine: NEGATIVE
Glucose, UA: NEGATIVE mg/dL
Hgb urine dipstick: NEGATIVE
Ketones, ur: NEGATIVE mg/dL
Leukocytes,Ua: NEGATIVE
Nitrite: NEGATIVE
Protein, ur: 30 mg/dL — AB
Specific Gravity, Urine: 1.018 (ref 1.005–1.030)
Squamous Epithelial / HPF: NONE SEEN (ref 0–5)
pH: 5 (ref 5.0–8.0)

## 2020-12-11 LAB — CBC
HCT: 47.3 % (ref 39.0–52.0)
Hemoglobin: 16.6 g/dL (ref 13.0–17.0)
MCH: 30.6 pg (ref 26.0–34.0)
MCHC: 35.1 g/dL (ref 30.0–36.0)
MCV: 87.3 fL (ref 80.0–100.0)
Platelets: 172 10*3/uL (ref 150–400)
RBC: 5.42 MIL/uL (ref 4.22–5.81)
RDW: 14.2 % (ref 11.5–15.5)
WBC: 7.3 10*3/uL (ref 4.0–10.5)
nRBC: 0 % (ref 0.0–0.2)

## 2020-12-11 MED ORDER — KETOROLAC TROMETHAMINE 30 MG/ML IJ SOLN
15.0000 mg | Freq: Once | INTRAMUSCULAR | Status: AC
  Administered 2020-12-11: 15 mg via INTRAVENOUS
  Filled 2020-12-11: qty 1

## 2020-12-11 MED ORDER — LACTATED RINGERS IV BOLUS
1000.0000 mL | Freq: Once | INTRAVENOUS | Status: AC
  Administered 2020-12-11: 1000 mL via INTRAVENOUS

## 2020-12-11 MED ORDER — OXYCODONE-ACETAMINOPHEN 5-325 MG PO TABS: 1.0000 | ORAL_TABLET | ORAL | 0 refills | Status: DC | PRN

## 2020-12-11 NOTE — ED Triage Notes (Signed)
Pt arrives in the ED from home via PoV with c/c of right side flank pain starting on Friday. Pt has Hx of renal stones and states "this is a kidney stone, they feel just like this.". Upon arrival, pt A&Ox4, in obvious distress.

## 2020-12-11 NOTE — ED Provider Notes (Signed)
[facility] Emergency Department Provider Note  Time seen: 9:24 AM  I have reviewed the triage vital signs and the nursing notes.   HISTORY  Chief Complaint Flank Pain (Right side/)   HPI Jordan Hughes is a 65 y.o. male with a past medical history of arthritis, gout, hypertension, kidney stones presents to the emergency department for right-sided abdominal pain.  According to the patient 2 days ago he developed right flank pain.  States intermittent but sharp and severe when it does occur.  States nausea but denies any vomiting or diarrhea.  No dysuria or hematuria.  Patient states a history of many kidney stones in the past which this feels identical.  Patient states the pain is much relieved currently.  States the pain is somewhat worse with movement.  Patient is status postcholecystectomy.   Past Medical History:  Diagnosis Date   Arthritis    joint pain   Gout    HOH (hard of hearing)    Hypertension    controlled on meds   Kidney stones     Patient Active Problem List   Diagnosis Date Noted   Prostatitis    Psoriatic arthritis ([place])    Generalized weakness 12/09/2019   AKI (acute kidney injury) ([place]) 12/09/2019   Hyperkalemia 12/09/2019   Nephrolithiasis 07/09/2019   Acute diverticulitis of intestine 07/08/2019   HTN (hypertension) 07/07/2019   Diverticulitis large intestine 07/07/2019   RA (rheumatoid arthritis) ([place]) 07/07/2019   Abdominal pain, generalized    Abnormal weight loss    Polyp of sigmoid colon    Benign neoplasm of ascending colon     Past Surgical History:  Procedure Laterality Date   abscess tonsil surgery     did not remove   APPENDECTOMY     CHOLECYSTECTOMY     COLONOSCOPY WITH PROPOFOL N/A 05/13/2017   Procedure: COLONOSCOPY WITH PROPOFOL;  Surgeon: [name], MD;  Location: [place];  Service: Endoscopy;  Laterality: N/A;   LITHOTRIPSY     patient states 5 times   POLYPECTOMY N/A 05/13/2017    Procedure: POLYPECTOMY;  Surgeon: [name], MD;  Location: [place];  Service: Endoscopy;  Laterality: N/A;    Prior to Admission medications   Medication Sig Start Date End Date Taking? Authorizing Provider  folic acid (FOLVITE) 1 MG tablet Take 1 mg by mouth daily. 03/20/19   [provider]  lisinopril (ZESTRIL) 10 MG tablet Take 10 mg by mouth daily. 12/12/19   [provider]  methotrexate (RHEUMATREX) 2.5 MG tablet Take 15 mg by mouth once a week. 06/07/19   [provider]  tadalafil (CIALIS) 5 MG tablet Take 1-2 tablets (5-10 mg total) by mouth daily as needed for erectile dysfunction. 08/23/20   Billey Co, MD  tamsulosin (FLOMAX) 0.4 MG CAPS capsule Take 0.4 mg daily by mouth. am 06/17/16   [provider]    No Known Allergies  Family History  Problem Relation Age of Onset   Bladder Cancer Paternal Uncle    Prostate cancer Neg Hx    Kidney cancer Neg Hx     Social History Social History   Tobacco Use   Smoking status: Never   Smokeless tobacco: Never  Substance Use Topics   Alcohol use: Yes    Alcohol/week: 10.0 standard drinks    Types: 10 Shots of liquor per week    Comment: only on Saturdays   Drug use: No    Review of Systems  Constitutional: Negative for fever. Cardiovascular: Negative for chest pain. Respiratory: Negative for shortness of breath. Gastrointestinal: Right flank pain.  Positive for nausea. Genitourinary: Negative for urinary compaints Musculoskeletal: Negative for musculoskeletal complaints Neurological: Negative for headache All other ROS negative  ____________________________________________   PHYSICAL EXAM:  VITAL SIGNS: ED Triage Vitals  Enc Vitals Group     BP 12/11/20 0656 107/83     Pulse Rate 12/11/20 0656 (!) 53     Resp 12/11/20 0656 20     Temp 12/11/20 0652 97.7 F (36.5 C)     Temp Source 12/11/20 0652 Oral     SpO2 12/11/20 0656 100 %     Weight 12/11/20 0657  140 lb (63.5 kg)     Height 12/11/20 0657 5\' 6"  (1.676 m)     Head Circumference --      Peak Flow --      Pain Score 12/11/20 0653 10     Pain Loc --      Pain Edu? --      Excl. in [place]? --     Constitutional: Alert and oriented. Well appearing and in no distress. Eyes: Normal exam ENT      Head: Normocephalic and atraumatic.      Mouth/Throat: Mucous membranes are moist. Cardiovascular: Normal rate, regular rhythm.  Respiratory: Normal respiratory effort without tachypnea nor retractions. Breath sounds are clear  Gastrointestinal: Soft and nontender. No distention.  Slight right CVA tenderness to palpation.  Otherwise benign exam. Musculoskeletal: Nontender with normal range of motion in all extremities.  Neurologic:  Normal speech and language. No gross focal neurologic deficits  Skin:  Skin is warm, dry and intact.  Psychiatric: Mood and affect are normal.   ____________________________________________     RADIOLOGY  CT shows no acute findings.  ____________________________________________   INITIAL IMPRESSION / ASSESSMENT AND PLAN / ED COURSE  Pertinent labs & imaging results that were available during my care of the patient were reviewed by me and considered in my medical decision making (see chart for details).   Patient presents emergency department for intermittent right flank pain.  Currently patient has benign abdominal exam besides mild right CVA tenderness.  Patient states it feels identical to past kidney stones he has had.  CT however does not show any acute abnormality.  Patient does have multiple renal stones but no ureteral stones.  Lab work largely [place].  No sign of UTI.  As the patient appears well I believe he is safe for discharge home with PCP follow-up.  We will prescribe a short course of pain medication for the patient until he can follow-up with his doctor.  I discussed return precautions.  Jordan Hughes was evaluated in Emergency  Department on 12/11/2020 for the symptoms described in the history of present illness. He was evaluated in the context of the global COVID-19 pandemic, which necessitated consideration that the patient might be at risk for infection with the SARS-CoV-2 virus that causes COVID-19. Institutional protocols and algorithms that pertain to the evaluation of patients at risk for COVID-19 are in a state of rapid change based on information released by regulatory bodies including the CDC and federal and state organizations. These policies and algorithms were followed during the patient's care in the ED.  ____________________________________________   FINAL CLINICAL IMPRESSION(S) / ED DIAGNOSES  Right flank pain   Harvest Dark, MD 12/11/20 [phone]

## 2021-05-20 ENCOUNTER — Emergency Department
Admission: EM | Admit: 2021-05-20 | Discharge: 2021-05-20 | Disposition: A | Discharge status: Home / Self Care | Payer: Medicare Other | Attending: Emergency Medicine | Admitting: Emergency Medicine

## 2021-05-20 ENCOUNTER — Emergency Department: Payer: Medicare Other

## 2021-05-20 DIAGNOSIS — N132 Hydronephrosis with renal and ureteral calculous obstruction: Secondary | ICD-10-CM | POA: Diagnosis not present

## 2021-05-20 DIAGNOSIS — N201 Calculus of ureter: Secondary | ICD-10-CM

## 2021-05-20 DIAGNOSIS — I10 Essential (primary) hypertension: Secondary | ICD-10-CM | POA: Diagnosis not present

## 2021-05-20 DIAGNOSIS — Z79899 Other long term (current) drug therapy: Secondary | ICD-10-CM | POA: Insufficient documentation

## 2021-05-20 DIAGNOSIS — R109 Unspecified abdominal pain: Secondary | ICD-10-CM | POA: Diagnosis present

## 2021-05-20 LAB — CBC
HCT: 51.8 % (ref 39.0–52.0)
Hemoglobin: 19 g/dL — ABNORMAL HIGH (ref 13.0–17.0)
MCH: 29.8 pg (ref 26.0–34.0)
MCHC: 36.7 g/dL — ABNORMAL HIGH (ref 30.0–36.0)
MCV: 81.3 fL (ref 80.0–100.0)
Platelets: 221 10*3/uL (ref 150–400)
RBC: 6.37 MIL/uL — ABNORMAL HIGH (ref 4.22–5.81)
RDW: 13 % (ref 11.5–15.5)
WBC: 11.8 10*3/uL — ABNORMAL HIGH (ref 4.0–10.5)
nRBC: 0 % (ref 0.0–0.2)

## 2021-05-20 LAB — URINALYSIS, COMPLETE (UACMP) WITH MICROSCOPIC
Bilirubin Urine: NEGATIVE
Glucose, UA: NEGATIVE mg/dL
Ketones, ur: NEGATIVE mg/dL
Nitrite: NEGATIVE
Protein, ur: 100 mg/dL — AB
RBC / HPF: >50 RBC/hpf — ABNORMAL HIGH (ref 0–5)
Specific Gravity, Urine: 1.013 (ref 1.005–1.030)
pH: 5 (ref 5.0–8.0)

## 2021-05-20 LAB — COMPREHENSIVE METABOLIC PANEL
ALT: 19 U/L (ref 0–44)
AST: 21 U/L (ref 15–41)
Albumin: 4.4 g/dL (ref 3.5–5.0)
Alkaline Phosphatase: 83 U/L (ref 38–126)
Anion gap: 10 (ref 5–15)
BUN: 11 mg/dL (ref 8–23)
CO2: 26 mmol/L (ref 22–32)
Calcium: 9.3 mg/dL (ref 8.9–10.3)
Chloride: 105 mmol/L (ref 98–111)
Creatinine, Ser: 1.11 mg/dL (ref 0.61–1.24)
GFR, Estimated: >60 mL/min (ref >60)
Glucose, Bld: 134 mg/dL — ABNORMAL HIGH (ref 70–99)
Potassium: 3.7 mmol/L (ref 3.5–5.1)
Sodium: 141 mmol/L (ref 135–145)
Total Bilirubin: 1.9 mg/dL — ABNORMAL HIGH (ref 0.3–1.2)
Total Protein: 7.4 g/dL (ref 6.5–8.1)

## 2021-05-20 LAB — LIPASE, BLOOD: Lipase: 41 U/L (ref 11–51)

## 2021-05-20 MED ORDER — METOCLOPRAMIDE HCL 5 MG/ML IJ SOLN
10.0000 mg | Freq: Once | INTRAMUSCULAR | Status: AC
  Administered 2021-05-20: 10 mg via INTRAVENOUS
  Filled 2021-05-20: qty 2

## 2021-05-20 MED ORDER — CEPHALEXIN 500 MG PO CAPS: 500.0000 mg | ORAL_CAPSULE | Freq: Four times a day (QID) | ORAL | 0 refills | Status: DC

## 2021-05-20 MED ORDER — ONDANSETRON HCL 4 MG/2ML IJ SOLN
4.0000 mg | Freq: Once | INTRAMUSCULAR | Status: AC
  Administered 2021-05-20: 4 mg via INTRAVENOUS
  Filled 2021-05-20: qty 2

## 2021-05-20 MED ORDER — SODIUM CHLORIDE 0.9 % IV BOLUS
1000.0000 mL | Freq: Once | INTRAVENOUS | Status: AC
  Administered 2021-05-20: 1000 mL via INTRAVENOUS

## 2021-05-20 MED ORDER — IBUPROFEN 400 MG PO TABS: 400.0000 mg | ORAL_TABLET | Freq: Four times a day (QID) | ORAL | 0 refills | Status: DC | PRN

## 2021-05-20 MED ORDER — CEPHALEXIN 500 MG PO CAPS
500.0000 mg | ORAL_CAPSULE | Freq: Once | ORAL | Status: AC
  Administered 2021-05-20: 500 mg via ORAL
  Filled 2021-05-20: qty 1

## 2021-05-20 MED ORDER — FENTANYL CITRATE PF 50 MCG/ML IJ SOSY
50.0000 ug | PREFILLED_SYRINGE | Freq: Once | INTRAMUSCULAR | Status: AC
  Administered 2021-05-20: 50 ug via INTRAVENOUS

## 2021-05-20 MED ORDER — FENTANYL CITRATE PF 50 MCG/ML IJ SOSY
PREFILLED_SYRINGE | INTRAMUSCULAR | Status: AC
  Filled 2021-05-20: qty 1

## 2021-05-20 MED ORDER — KETOROLAC TROMETHAMINE 30 MG/ML IJ SOLN
15.0000 mg | Freq: Once | INTRAMUSCULAR | Status: AC
  Administered 2021-05-20: 15 mg via INTRAVENOUS
  Filled 2021-05-20: qty 1

## 2021-05-20 MED ORDER — MORPHINE SULFATE (PF) 4 MG/ML IV SOLN
4.0000 mg | Freq: Once | INTRAVENOUS | Status: AC
Start: 2021-05-20 — End: 2021-05-20
  Administered 2021-05-20: 4 mg via INTRAVENOUS
  Filled 2021-05-20: qty 1

## 2021-05-20 NOTE — ED Triage Notes (Signed)
C/O left flank pain today.  C/O hematuria  Has history of kidney stones

## 2021-05-20 NOTE — ED Provider Notes (Signed)
[facility]  ____________________________________________   Event Date/Time   First MD Initiated Contact with Patient 05/20/21 1806     (approximate)  I have reviewed the triage vital signs and the nursing notes.   HISTORY  Chief Complaint    HPI Jordan Hughes is a 65 y.o. male with past medical history of arthritis, hypertension, kidney stones who presents with flank pain.  Symptoms started this morning.  He endorses pain in the left back as well as in the left groin.  He has had multiple episodes of emesis and feels nauseous currently.  Also has had blood in his urine and pain with urination.  He does have a history of kidney stones says that the pain is normally been higher but this does feel similar.  Denies fevers or chills.         Past Medical History:  Diagnosis Date   Arthritis    joint pain   Gout    HOH (hard of hearing)    Hypertension    controlled on meds   Kidney stones     Patient Active Problem List   Diagnosis Date Noted   Prostatitis    Psoriatic arthritis ([place])    Generalized weakness 12/09/2019   AKI (acute kidney injury) ([place]) 12/09/2019   Hyperkalemia 12/09/2019   Nephrolithiasis 07/09/2019   Acute diverticulitis of intestine 07/08/2019   HTN (hypertension) 07/07/2019   Diverticulitis large intestine 07/07/2019   RA (rheumatoid arthritis) ([place]) 07/07/2019   Abdominal pain, generalized    Abnormal weight loss    Polyp of sigmoid colon    Benign neoplasm of ascending colon     Past Surgical History:  Procedure Laterality Date   abscess tonsil surgery     did not remove   APPENDECTOMY     CHOLECYSTECTOMY     COLONOSCOPY WITH PROPOFOL N/A 05/13/2017   Procedure: COLONOSCOPY WITH PROPOFOL;  Surgeon: [name], MD;  Location: [place];  Service: Endoscopy;  Laterality: N/A;   LITHOTRIPSY     patient states 5 times   POLYPECTOMY N/A 05/13/2017   Procedure: POLYPECTOMY;  Surgeon: [name],  MD;  Location: [place];  Service: Endoscopy;  Laterality: N/A;    Prior to Admission medications   Medication Sig Start Date End Date Taking? Authorizing Provider  folic acid (FOLVITE) 1 MG tablet Take 1 mg by mouth daily. 03/20/19   [provider]  lisinopril (ZESTRIL) 10 MG tablet Take 10 mg by mouth daily. 12/12/19   [provider]  methotrexate (RHEUMATREX) 2.5 MG tablet Take 15 mg by mouth once a week. 06/07/19   [provider]  oxyCODONE-acetaminophen (PERCOCET) 5-325 MG tablet Take 1 tablet by mouth every 4 (four) hours as needed for severe pain. 12/11/20   Harvest Dark, MD  tadalafil (CIALIS) 5 MG tablet Take 1-2 tablets (5-10 mg total) by mouth daily as needed for erectile dysfunction. 08/23/20   Billey Co, MD  tamsulosin (FLOMAX) 0.4 MG CAPS capsule Take 0.4 mg daily by mouth. am 06/17/16   [provider]    Allergies Patient has no known allergies.  Family History  Problem Relation Age of Onset   Bladder Cancer Paternal Uncle    Prostate cancer Neg Hx    Kidney cancer Neg Hx     Social History Social History   Tobacco Use   Smoking status: Never   Smokeless tobacco: Never  Substance Use Topics   Alcohol use: Yes  Alcohol/week: 10.0 standard drinks    Types: 10 Shots of liquor per week    Comment: only on Saturdays   Drug use: No    Review of Systems   Review of Systems  Constitutional:  Negative for chills and fever.  Respiratory:  Negative for shortness of breath.   Gastrointestinal:  Positive for nausea and vomiting.  Genitourinary:  Positive for difficulty urinating, flank pain and hematuria.  All other systems reviewed and are negative.  Physical Exam Updated Vital Signs BP 140/70   Pulse (!) 50   Resp 17   Ht 5\' 6"  (1.676 m)   Wt 63.5 kg   SpO2 97%   BMI 22.60 kg/m   Physical Exam Vitals and nursing note reviewed.  Constitutional:      General: He is in acute distress.      Appearance: Normal appearance.     Comments: Patient appears uncomfortable  HENT:     Head: Normocephalic and atraumatic.  Eyes:     General: No scleral icterus.    Conjunctiva/sclera: Conjunctivae normal.  Pulmonary:     Effort: Pulmonary effort is normal. No respiratory distress.     Breath sounds: Normal breath sounds. No wheezing.  Abdominal:     Tenderness: There is no right CVA tenderness or left CVA tenderness.     Comments: Diminished soft and nontender Mild tenderness to palpation in the left mid back  Musculoskeletal:        General: No deformity or signs of injury.     Cervical back: Normal range of motion.  Skin:    Coloration: Skin is not jaundiced or pale.  Neurological:     General: No focal deficit present.     Mental Status: He is alert and oriented to person, place, and time. Mental status is at baseline.  Psychiatric:        Mood and Affect: Mood normal.        Behavior: Behavior normal.     LABS (all labs ordered are listed, but only abnormal results are displayed)  Labs Reviewed  COMPREHENSIVE METABOLIC PANEL - Abnormal; Notable for the following components:      Result Value   Glucose, Bld 134 (*)    Total Bilirubin 1.9 (*)    All other components within normal limits  CBC - Abnormal; Notable for the following components:   WBC 11.8 (*)    RBC 6.37 (*)    Hemoglobin 19.0 (*)    MCHC 36.7 (*)    All other components within normal limits  URINALYSIS, COMPLETE (UACMP) WITH MICROSCOPIC - Abnormal; Notable for the following components:   Color, Urine AMBER (*)    APPearance CLOUDY (*)    Hgb urine dipstick LARGE (*)    Protein, ur 100 (*)    Leukocytes,Ua TRACE (*)    RBC / HPF >50 (*)    Bacteria, UA RARE (*)    All other components within normal limits  URINE CULTURE  LIPASE, BLOOD   ____________________________________________  EKG  N/a ____________________________________________  RADIOLOGY Almeta Monas, personally viewed and  evaluated these images (plain radiographs) as part of my medical decision making, as well as reviewing the written report by the radiologist.  ED MD interpretation: I reviewed the CT renal study which shows a 11mm stone in the left ureter causing hydronephrosis    ____________________________________________   PROCEDURES  Procedure(s) performed (including Critical Care):  Procedures   ____________________________________________   INITIAL IMPRESSION / ASSESSMENT AND PLAN /  ED COURSE     66 year old male presents with left-sided back/flank pain and hematuria.  Patient looks uncomfortable on arrival.  Was given fentanyl initially.  Is a history of kidney stones in the past and currently having hematuria.  Presentation is certainly concerning for recurrent kidney stone.  Will check UA to rule out concomitant infection.  Given his age will also obtain a CT renal study to rule out AAA, renal infarct or other more concerning pathology.  Will treat with Toradol and morphine now.  Labs also obtained from triage.  Patient's labs are reassuring, mild leukocytosis to 11.8.  He does have a 7 mm stone in the left ureter causing hydronephrosis.  After pain meds patient is much more comfortable.  Discussed with on-call urologist Dr. Jeffie Pollock in order to facilitate follow-up.  Will discharge with ibuprofen.  His UA does have some whites with rare bacteria so will cover with Keflex as well.  He is afebrile without systemic symptoms of infection so my suspicion for concomitant [place] is low.  Clinical Course as of 05/20/21 2026  Sat May 20, 2021  1902 WBC(!): 11.8 [KM]  1951 Obstructing 7 mm mid left ureteral calculus, with moderate left-sided hydronephrosis.   [KM]    Clinical Course User Index [KM] Rada Hay, MD     ____________________________________________   FINAL CLINICAL IMPRESSION(S) / ED DIAGNOSES  Final diagnoses:  Ureteral stone     ED Discharge Orders     None         Note:  This document was prepared using Dragon voice recognition software and may include unintentional dictation errors.    Rada Hay, MD 05/20/21 2026

## 2021-05-20 NOTE — Discharge Instructions (Signed)
You have a kidney stone in the left ureter which is causing your pain.  Please take ibuprofen every 6 hours for your pain.  Please also take the antibiotic Keflex to cover for any infection in your urine.  Please follow-up with urology in about 1 week.

## 2021-05-22 LAB — URINE CULTURE: Culture: NO GROWTH

## 2021-05-24 ENCOUNTER — Other Ambulatory Visit: Payer: Self-pay | Admitting: Urology

## 2021-05-24 ENCOUNTER — Ambulatory Visit: Payer: Medicare Other | Admitting: Urology

## 2021-05-24 ENCOUNTER — Ambulatory Visit
Admission: RE | Admit: 2021-05-24 | Discharge: 2021-05-24 | Disposition: A | Discharge status: Home / Self Care | Payer: Medicare Other | Source: Ambulatory Visit | Attending: Urology | Admitting: Urology

## 2021-05-24 ENCOUNTER — Encounter: Payer: Self-pay | Admitting: Urology

## 2021-05-24 ENCOUNTER — Other Ambulatory Visit: Payer: Self-pay

## 2021-05-24 ENCOUNTER — Ambulatory Visit
Admission: RE | Admit: 2021-05-24 | Discharge: 2021-05-24 | Disposition: A | Discharge status: Home / Self Care | Payer: Medicare Other | Attending: Urology | Admitting: Urology

## 2021-05-24 VITALS — BP 120/81 | HR 56 | Ht 66.0 in | Wt 140.0 lb

## 2021-05-24 DIAGNOSIS — N200 Calculus of kidney: Secondary | ICD-10-CM

## 2021-05-24 DIAGNOSIS — N201 Calculus of ureter: Secondary | ICD-10-CM | POA: Diagnosis not present

## 2021-05-24 NOTE — Progress Notes (Unsigned)
[facility] ORDER FORM  Expected date of procedure: 06/01/21  Surgeon: MD on truck for the day  Post op standing: 2-4wk follow up w/KUB prior with PA  Anticoagulation/Aspirin/NSAID standing order: Okay to continue(distal stone)  Anesthesia standing order: MAC  VTE standing: SCD's  Dx: Left Ureteral Stone  Procedure: left Extracorporeal shock wave lithotripsy  CPT : 82800  Standing Order Set:   *NPO after mn, KUB  *NS 125m/hr, Keflex 5047mPO, Benadryl 2583mO, Valium 68m54m, Zofran 4mg 13m   Medications if other than standing orders:   NONE

## 2021-05-24 NOTE — Progress Notes (Signed)
   05/24/2021 8:44 AM   Jordan Hughes August 19, 1955 [id number]  Reason for visit: Left ureteral stone  HPI: 65 year old male I have previously followed for BPH, benign prostate cysts, ED, nephrolithiasis.  He has a known hypoechoic lesion in the right side of the prostate that had stable on CT, but he ultimately underwent a prostate MRI that showed no evidence of malignancy and no abscess.  PSA has been normal.  He presented to the ED on 05/20/2021 with severe left-sided flank pain, and CT showed a 7 mm left mid ureteral stone with upstream hydronephrosis.  I personally viewed and interpreted the CT that shows a 7 mm mid left ureteral stone, clearly visible on scout CT, 900HU, 9.5cm SSD anteriorly.  Labs were essentially benign, and urine culture was negative and he was discharged with medical expulsive therapy.  I reviewed the KUB today that shows subtle migration of the stone to the upper part of the distal ureter overlying the pelvis.  He continues to have intermittent left-sided flank and groin pain and overall malaise, denies any fevers or chills.  He is avoiding narcotics, and pain is managed currently with NSAIDs.  We discussed various treatment options for urolithiasis including observation with or without medical expulsive therapy, shockwave lithotripsy (SWL), ureteroscopy and laser lithotripsy with stent placement, and percutaneous nephrolithotomy.  We discussed that management is based on stone size, location, density, patient co-morbidities, and patient preference.   Stones <78mm in size have a >80% spontaneous passage rate. Data surrounding the use of tamsulosin for medical expulsive therapy is controversial, but meta analyses suggests it is most efficacious for distal stones between 5-69mm in size. Possible side effects include dizziness/lightheadedness, and retrograde ejaculation.  SWL has a lower stone free rate in a single procedure, but also a lower complication rate compared  to ureteroscopy and avoids a stent and associated stent related symptoms. Possible complications include renal hematoma, steinstrasse, and need for additional treatment.  Ureteroscopy with laser lithotripsy and stent placement has a higher stone free rate than SWL in a single procedure, however increased complication rate including possible infection, ureteral injury, bleeding, and stent related morbidity. Common stent related symptoms include dysuria, urgency/frequency, and flank pain.  He prefers another week of medical expulsive therapy, and to schedule left shockwave lithotripsy next Thursday   [name], MD  [facility] [address] [place], [place] [address] [phone]

## 2021-05-24 NOTE — Patient Instructions (Signed)
Lithotripsy Lithotripsy is a treatment that can help break up kidney stones that are too large to pass on their own. This is a nonsurgical procedure that crushes a kidney stone with shock waves. These shock waves pass through your body and focus on the kidney stone. They cause the kidney stone to break up into smaller pieces while it is still in the urinary tract. The smaller pieces of stone can pass more easily out of your body in the urine. Tell a health care provider about: Any allergies you have. All medicines you are taking, including vitamins, herbs, eye drops, creams, and over-the-counter medicines. Any problems you or family members have had with anesthetic medicines. Any blood disorders you have. Any surgeries you have had. Any medical conditions you have. Whether you are pregnant or may be pregnant. What are the risks? Generally, this is a safe procedure. However, problems may occur, including: Infection. Bleeding from the kidney. Bruising of the kidney or skin. Scarring of the kidney, which can lead to: Increased blood pressure. Poor kidney function. Return (recurrence) of kidney stones. Damage to other structures or organs, such as the liver, colon, spleen, or pancreas. Blockage (obstruction) of the tube that carries urine from the kidney to the bladder (ureter). Failure of the kidney stone to break into pieces (fragments). What happens before the procedure? Staying hydrated Follow instructions from your health care provider about hydration, which may include: Up to 2 hours before the procedure - you may continue to drink clear liquids, such as water, clear fruit juice, black coffee, and plain tea. Eating and drinking restrictions Follow instructions from your health care provider about eating and drinking, which may include: 8 hours before the procedure - stop eating heavy meals or foods, such as meat, fried foods, or fatty foods. 6 hours before the procedure - stop eating  light meals or foods, such as toast or cereal. 6 hours before the procedure - stop drinking milk or drinks that contain milk. 2 hours before the procedure - stop drinking clear liquids. Medicines Ask your health care provider about: Changing or stopping your regular medicines. This is especially important if you are taking diabetes medicines or blood thinners. Taking medicines such as aspirin and ibuprofen. These medicines can thin your blood. Do not take these medicines unless your health care provider tells you to take them. Taking over-the-counter medicines, vitamins, herbs, and supplements. Tests You may have tests, such as: Blood tests. Urine tests. Imaging tests, such as a CT scan. General instructions Plan to have someone take you home from the hospital or clinic. If you will be going home right after the procedure, plan to have someone with you for 24 hours. Ask your health care provider what steps will be taken to help prevent infection. These may include washing skin with a germ-killing soap. What happens during the procedure?  An IV will be inserted into one of your veins. You will be given one or more of the following: A medicine to help you relax (sedative). A medicine to make you fall asleep (general anesthetic). A water-filled cushion may be placed behind your kidney or on your abdomen. In some cases, you may be placed in a tub of lukewarm water. Your body will be positioned in a way that makes it easy to target the kidney stone. An X-ray or ultrasound exam will be done to locate your stone. Shock waves will be aimed at the stone. If you are awake, you may feel a tapping sensation as  the shock waves pass through your body. A flexible tube with holes in it (stent) may be placed in the ureter. This will help keep urine flowing from the kidney if the fragments of the stone have been blocking the ureter. The procedure may vary among health care providers and hospitals. What  happens after the procedure? You may have an X-ray to see whether the procedure was able to break up the kidney stone and how much of the stone has passed. If large stone fragments remain after treatment, you may need to have a second procedure at a later time. Your blood pressure, heart rate, breathing rate, and blood oxygen level will be monitored until you leave the hospital or clinic. You may be given antibiotics or pain medicine as needed. If a stent was placed in your ureter during surgery, it may stay in place for a few weeks. You may need to strain your urine to collect pieces of the kidney stone for testing. You will need to drink plenty of water. If you were given a sedative during the procedure, it can affect you for several hours. Do not drive or operate machinery until your health care provider says that it is safe. Summary Lithotripsy is a treatment that can help break up kidney stones that are too large to pass on their own. Lithotripsy is a nonsurgical procedure that crushes a kidney stone with shock waves. Generally, this is a safe procedure. However, problems may occur, including damage to the kidney or other organs, infection, or obstruction of the tube that carries urine from the kidney to the bladder (ureter). You may have a stent placed in your ureter to help drain your urine. This stent may stay in place for a few weeks. After the procedure, you will need to drink plenty of water. You may be asked to strain your urine to collect pieces of the kidney stone for testing. This information is not intended to replace advice given to you by your health care provider. Make sure you discuss any questions you have with your health care provider. Document Revised: 03/31/2019 Document Reviewed: 04/01/2019 Elsevier Patient Education  [place].

## 2021-05-24 NOTE — H&P (View-Only) (Signed)
   05/24/2021 8:44 AM   Jordan Hughes [birth year] [id number]  Reason for visit: Left ureteral stone  HPI: 65 year old male I have previously followed for BPH, benign prostate cysts, ED, nephrolithiasis.  He has a known hypoechoic lesion in the right side of the prostate that had stable on CT, but he ultimately underwent a prostate MRI that showed no evidence of malignancy and no abscess.  PSA has been normal.  He presented to the ED on 05/20/2021 with severe left-sided flank pain, and CT showed a 7 mm left mid ureteral stone with upstream hydronephrosis.  I personally viewed and interpreted the CT that shows a 7 mm mid left ureteral stone, clearly visible on scout CT, 900HU, 9.5cm SSD anteriorly.  Labs were essentially benign, and urine culture was negative and he was discharged with medical expulsive therapy.  I reviewed the KUB today that shows subtle migration of the stone to the upper part of the distal ureter overlying the pelvis.  He continues to have intermittent left-sided flank and groin pain and overall malaise, denies any fevers or chills.  He is avoiding narcotics, and pain is managed currently with NSAIDs.  We discussed various treatment options for urolithiasis including observation with or without medical expulsive therapy, shockwave lithotripsy (SWL), ureteroscopy and laser lithotripsy with stent placement, and percutaneous nephrolithotomy.  We discussed that management is based on stone size, location, density, patient co-morbidities, and patient preference.   Stones <32mm in size have a >80% spontaneous passage rate. Data surrounding the use of tamsulosin for medical expulsive therapy is controversial, but meta analyses suggests it is most efficacious for distal stones between 5-62mm in size. Possible side effects include dizziness/lightheadedness, and retrograde ejaculation.  SWL has a lower stone free rate in a single procedure, but also a lower complication rate compared  to ureteroscopy and avoids a stent and associated stent related symptoms. Possible complications include renal hematoma, steinstrasse, and need for additional treatment.  Ureteroscopy with laser lithotripsy and stent placement has a higher stone free rate than SWL in a single procedure, however increased complication rate including possible infection, ureteral injury, bleeding, and stent related morbidity. Common stent related symptoms include dysuria, urgency/frequency, and flank pain.  He prefers another week of medical expulsive therapy, and to schedule left shockwave lithotripsy next Thursday   Elynore Dolinski C Collie Wernick, MD  [facility] [address] [place], [place] [address] [phone]

## 2021-06-01 ENCOUNTER — Ambulatory Visit: Payer: Medicare Other

## 2021-06-01 ENCOUNTER — Ambulatory Visit
Admission: RE | Admit: 2021-06-01 | Discharge: 2021-06-01 | Disposition: A | Discharge status: Home / Self Care | Payer: Medicare Other | Attending: Urology | Admitting: Urology

## 2021-06-01 ENCOUNTER — Encounter: Payer: Self-pay | Admitting: Urology

## 2021-06-01 ENCOUNTER — Other Ambulatory Visit: Payer: Self-pay

## 2021-06-01 ENCOUNTER — Encounter
Admission: RE | Disposition: A | Discharge status: Home / Self Care | Payer: Self-pay | Source: Home / Self Care | Attending: Urology

## 2021-06-01 DIAGNOSIS — N201 Calculus of ureter: Secondary | ICD-10-CM | POA: Diagnosis not present

## 2021-06-01 HISTORY — PX: EXTRACORPOREAL SHOCK WAVE LITHOTRIPSY: SHX1557

## 2021-06-01 SURGERY — LITHOTRIPSY, ESWL
Anesthesia: Moderate Sedation | Laterality: Left

## 2021-06-01 MED ORDER — HYDROCODONE-ACETAMINOPHEN 5-325 MG PO TABS: 1.0000 | ORAL_TABLET | Freq: Four times a day (QID) | ORAL | 0 refills | Status: DC | PRN

## 2021-06-01 MED ORDER — DIPHENHYDRAMINE HCL 25 MG PO CAPS
ORAL_CAPSULE | ORAL | Status: AC
  Administered 2021-06-01: 25 mg via ORAL
  Filled 2021-06-01: qty 1

## 2021-06-01 MED ORDER — ONDANSETRON HCL 4 MG/2ML IJ SOLN
INTRAMUSCULAR | Status: AC
  Administered 2021-06-01: 4 mg via INTRAVENOUS
  Filled 2021-06-01: qty 2

## 2021-06-01 MED ORDER — ONDANSETRON HCL 4 MG/2ML IJ SOLN: 4.0000 mg | Freq: Once | INTRAMUSCULAR | Status: AC | PRN

## 2021-06-01 MED ORDER — DIAZEPAM 5 MG PO TABS
ORAL_TABLET | ORAL | Status: AC
  Administered 2021-06-01: 10 mg via ORAL
  Filled 2021-06-01: qty 2

## 2021-06-01 MED ORDER — CEPHALEXIN 500 MG PO CAPS
ORAL_CAPSULE | ORAL | Status: AC
  Administered 2021-06-01: 500 mg via ORAL
  Filled 2021-06-01: qty 1

## 2021-06-01 MED ORDER — DIAZEPAM 5 MG PO TABS: 10.0000 mg | ORAL_TABLET | ORAL | Status: AC

## 2021-06-01 MED ORDER — SODIUM CHLORIDE 0.9 % IV SOLN: INTRAVENOUS | Status: DC

## 2021-06-01 MED ORDER — CEPHALEXIN 500 MG PO CAPS: 500.0000 mg | ORAL_CAPSULE | ORAL | Status: AC

## 2021-06-01 MED ORDER — DIPHENHYDRAMINE HCL 25 MG PO CAPS: 25.0000 mg | ORAL_CAPSULE | ORAL | Status: AC

## 2021-06-01 NOTE — Interval H&P Note (Signed)
History and Physical Interval Note:  06/01/2021 7:45 AM  Jordan Hughes  has presented today for surgery, with the diagnosis of left ureteral stone.  The various methods of treatment have been discussed with the patient and family. After consideration of risks, benefits and other options for treatment, the patient has consented to  Procedure(s): EXTRACORPOREAL SHOCK WAVE LITHOTRIPSY (ESWL) (Left) as a surgical intervention.  The patient's history has been reviewed, patient examined, no change in status, stable for surgery.  I have reviewed the patient's chart and labs.  Questions were answered to the patient's satisfaction.    RRR CTAB  Hollice Espy

## 2021-06-01 NOTE — Discharge Instructions (Signed)
See [facility] discharge instructions in chart.  

## 2021-06-05 ENCOUNTER — Other Ambulatory Visit: Payer: Self-pay

## 2021-06-05 DIAGNOSIS — N201 Calculus of ureter: Secondary | ICD-10-CM

## 2021-06-21 ENCOUNTER — Ambulatory Visit: Payer: Medicare Other | Admitting: Urology

## 2021-06-22 ENCOUNTER — Encounter: Payer: Medicare Other | Admitting: Physician Assistant

## 2021-06-22 ENCOUNTER — Encounter: Payer: Self-pay | Admitting: Physician Assistant

## 2021-08-03 ENCOUNTER — Other Ambulatory Visit: Payer: Self-pay

## 2021-08-03 ENCOUNTER — Telehealth: Payer: Self-pay

## 2021-08-03 NOTE — Telephone Encounter (Signed)
Scheduled for 09/25/2021

## 2021-08-29 ENCOUNTER — Ambulatory Visit: Payer: Medicare Other | Admitting: Urology

## 2021-08-29 ENCOUNTER — Encounter: Payer: Self-pay | Admitting: Urology

## 2021-08-29 ENCOUNTER — Other Ambulatory Visit: Payer: Self-pay | Admitting: *Deleted

## 2021-08-29 ENCOUNTER — Other Ambulatory Visit: Payer: Self-pay

## 2021-08-29 ENCOUNTER — Ambulatory Visit
Admission: RE | Admit: 2021-08-29 | Discharge: 2021-08-29 | Disposition: A | Discharge status: Home / Self Care | Payer: Medicare Other | Source: Ambulatory Visit | Attending: Urology | Admitting: Urology

## 2021-08-29 ENCOUNTER — Ambulatory Visit
Admission: RE | Admit: 2021-08-29 | Discharge: 2021-08-29 | Disposition: A | Discharge status: Home / Self Care | Payer: Medicare Other | Attending: Urology | Admitting: Urology

## 2021-08-29 VITALS — BP 127/83 | HR 90 | Ht 66.0 in | Wt 147.0 lb

## 2021-08-29 DIAGNOSIS — N201 Calculus of ureter: Secondary | ICD-10-CM

## 2021-08-29 DIAGNOSIS — N200 Calculus of kidney: Secondary | ICD-10-CM | POA: Diagnosis not present

## 2021-08-29 DIAGNOSIS — N401 Enlarged prostate with lower urinary tract symptoms: Secondary | ICD-10-CM

## 2021-08-29 DIAGNOSIS — N138 Other obstructive and reflux uropathy: Secondary | ICD-10-CM

## 2021-08-29 MED ORDER — TAMSULOSIN HCL 0.4 MG PO CAPS: 0.4000 mg | ORAL_CAPSULE | Freq: Every day | ORAL | 3 refills | Status: AC

## 2021-08-29 NOTE — Patient Instructions (Signed)
Dietary Guidelines to Help Prevent Kidney Stones Kidney stones are deposits of minerals and salts that form inside your kidneys. Your risk of developing kidney stones may be greater depending on your diet, your lifestyle, the medicines you take, and whether you have certain medical conditions. Most people can lower their chances of developing kidney stones by following the instructions below. Your dietitian may give you more specific instructions depending on your overall health and the type of kidney stones you tend to develop. What are tips for following this plan? Reading food labels  Choose foods with "no salt added" or "low-salt" labels. Limit your salt (sodium) intake to less than 1,500 mg a day. Choose foods with calcium for each meal and snack. Try to eat about 300 mg of calcium at each meal. Foods that contain 200-500 mg of calcium a serving include: 8 oz (237 mL) of milk, calcium-fortifiednon-dairy milk, and calcium-fortifiedfruit juice. Calcium-fortified means that calcium has been added to these drinks. 8 oz (237 mL) of kefir, yogurt, and soy yogurt. 4 oz (114 g) of tofu. 1 oz (28 g) of cheese. 1 cup (150 g) of dried figs. 1 cup (91 g) of cooked broccoli. One 3 oz (85 g) can of sardines or mackerel. Most people need 1,000-1,500 mg of calcium a day. Talk to your dietitian about how much calcium is recommended for you. Shopping Buy plenty of fresh fruits and vegetables. Most people do not need to avoid fruits and vegetables, even if these foods contain nutrients that may contribute to kidney stones. When shopping for convenience foods, choose: Whole pieces of fruit. Pre-made salads with dressing on the side. Low-fat fruit and yogurt smoothies. Avoid buying frozen meals or prepared deli foods. These can be high in sodium. Look for foods with live cultures, such as yogurt and kefir. Choose high-fiber grains, such as whole-wheat breads, oat bran, and wheat cereals. Cooking Do not add  salt to food when cooking. Place a salt shaker on the table and allow each person to add his or her own salt to taste. Use vegetable protein, such as beans, textured vegetable protein (TVP), or tofu, instead of meat in pasta, casseroles, and soups. Meal planning Eat less salt, if told by your dietitian. To do this: Avoid eating processed or pre-made food. Avoid eating fast food. Eat less animal protein, including cheese, meat, poultry, or fish, if told by your dietitian. To do this: Limit the number of times you have meat, poultry, fish, or cheese each week. Eat a diet free of meat at least 2 days a week. Eat only one serving each day of meat, poultry, fish, or seafood. When you prepare animal protein, cut pieces into small portion sizes. For most meat and fish, one serving is about the size of the palm of your hand. Eat at least five servings of fresh fruits and vegetables each day. To do this: Keep fruits and vegetables on hand for snacks. Eat one piece of fruit or a handful of berries with breakfast. Have a salad and fruit at lunch. Have two kinds of vegetables at dinner. Limit foods that are high in a substance called oxalate. These include: Spinach (cooked), rhubarb, beets, sweet potatoes, and Swiss chard. Peanuts. Potato chips, french fries, and baked potatoes with skin on. Nuts and nut products. Chocolate. If you regularly take a diuretic medicine, make sure to eat at least 1 or 2 servings of fruits or vegetables that are high in potassium each day. These include: Avocado. Banana. Orange, prune,   carrot, or tomato juice. Baked potato. Cabbage. Beans and split peas. Lifestyle  Drink enough fluid to keep your urine pale yellow. This is the most important thing you can do. Spread your fluid intake throughout the day. If you drink alcohol: Limit how much you use to: 0-1 drink a day for women who are not pregnant. 0-2 drinks a day for men. Be aware of how much alcohol is in your  drink. In the [place] one drink equals one 12 oz bottle of beer (355 mL), one 5 oz glass of wine (148 mL), or one 1 oz glass of hard liquor (44 mL). Lose weight if told by your health care provider. Work with your dietitian to find an eating plan and weight loss strategies that work best for you. General information Talk to your health care provider and dietitian about taking daily supplements. You may be told the following depending on your health and the cause of your kidney stones: Not to take supplements with vitamin C. To take a calcium supplement. To take a daily probiotic supplement. To take other supplements such as magnesium, fish oil, or vitamin B6. Take over-the-counter and prescription medicines only as told by your health care provider. These include supplements. What foods should I limit? Limit your intake of the following foods, or eat them as told by your dietitian. Vegetables Spinach. Rhubarb. Beets. Canned vegetables. Pickles. Olives. Baked potatoes with skin. Grains Wheat bran. Baked goods. Salted crackers. Cereals high in sugar. Meats and other proteins Nuts. Nut butters. Large portions of meat, poultry, or fish. Salted, precooked, or cured meats, such as sausages, meat loaves, and hot dogs. Dairy Cheese. Beverages Regular soft drinks. Regular vegetable juice. Seasonings and condiments Seasoning blends with salt. Salad dressings. Soy sauce. Ketchup. Barbecue sauce. Other foods Canned soups. Canned pasta sauce. Casseroles. Pizza. Lasagna. Frozen meals. Potato chips. French fries. The items listed above may not be a complete list of foods and beverages you should limit. Contact a dietitian for more information. What foods should I avoid? Talk to your dietitian about specific foods you should avoid based on the type of kidney stones you have and your overall health. Fruits Grapefruit. The item listed above may not be a complete list of foods and beverages you should  avoid. Contact a dietitian for more information. Summary Kidney stones are deposits of minerals and salts that form inside your kidneys. You can lower your risk of kidney stones by making changes to your diet. The most important thing you can do is drink enough fluid. Drink enough fluid to keep your urine pale yellow. Talk to your dietitian about how much calcium you should have each day, and eat less salt and animal protein as told by your dietitian. This information is not intended to replace advice given to you by your health care provider. Make sure you discuss any questions you have with your health care provider. Document Revised: 06/11/2019 Document Reviewed: 06/11/2019 Elsevier Patient Education  2022 Elsevier Inc.  

## 2021-08-29 NOTE — Progress Notes (Signed)
° °  08/29/2021 1:20 PM   CADIN LUKA 06/21/1956 [id number]  Reason for visit: Follow up nephrolithiasis, prostate cyst, BPH  HPI: 66 year old male on Flomax for BPH who recently underwent a left shockwave lithotripsy for 7 mm distal ureteral stone with Dr. Erlene Quan on 27-Jun-2021.  He has passed a number of fragments since that time, and feels like his symptoms have improved.  He has some chronic back pain that can overlap with kidney stone type symptoms..  He brought a number of stone fragments with him to clinic today.  I personally viewed and interpreted his KUB today that shows interval resolution of the left distal ureteral stone.  We discussed return precautions at length, and if he has persistent pain could consider CT for further evaluation if his back pain is related to persistent stone fragments versus his chronic back pain.  We discussed the limitations of KUB for small stone fragments.  -Flomax refilled for BPH -Return precautions discussed, consider CT if recurrent back pain/kidney stone type symptoms -RTC 1 year BPH, kidney stone routine follow-up.  He has a history of a prostate cyst originally seen on CT and further evaluation of this with MRI in August 2021 showed a benign lesion.  Billey Co, [facility] [address] [place], [place] [address] [phone]

## 2021-09-25 ENCOUNTER — Telehealth: Payer: Self-pay

## 2021-09-25 ENCOUNTER — Ambulatory Visit: Payer: 59 | Admitting: Gastroenterology

## 2021-09-25 ENCOUNTER — Other Ambulatory Visit: Payer: Self-pay | Admitting: Gastroenterology

## 2021-09-25 DIAGNOSIS — Z86010 Personal history of colonic polyps: Secondary | ICD-10-CM

## 2021-09-25 MED ORDER — PEG 3350-KCL-NABCB-NACL-NASULF 236 G PO SOLR: 4000.0000 mL | Freq: Once | ORAL | 0 refills | Status: AC

## 2021-09-25 NOTE — Telephone Encounter (Signed)
Pt was scheduled for an OV, Hx of colon polyps. Per Dr Allen Norris, pt just needs to be scheduled for colonoscopy, appt not needed... ? ?I called pt and he reports no concerns or Sx at this time and is agreeable to scheduling colonoscopy ? ?Procedure scheduled for 10/16/21 ?Rx sent through e-scribe ?Instruction gone over with pt and mailed ?

## 2021-10-09 ENCOUNTER — Encounter: Payer: Self-pay | Admitting: Gastroenterology

## 2021-10-10 ENCOUNTER — Encounter: Payer: Self-pay | Admitting: Gastroenterology

## 2021-10-16 ENCOUNTER — Ambulatory Visit: Payer: Medicare Other | Admitting: Anesthesiology

## 2021-10-16 ENCOUNTER — Encounter: Payer: Self-pay | Admitting: Emergency Medicine

## 2021-10-16 ENCOUNTER — Encounter: Payer: Self-pay | Admitting: Gastroenterology

## 2021-10-16 ENCOUNTER — Other Ambulatory Visit: Payer: Self-pay

## 2021-10-16 ENCOUNTER — Encounter
Admission: RE | Disposition: A | Discharge status: Other Acute Inpatient Hospital | Payer: Self-pay | Source: Home / Self Care | Attending: Gastroenterology

## 2021-10-16 ENCOUNTER — Emergency Department: Payer: Medicare Other

## 2021-10-16 ENCOUNTER — Emergency Department
Admission: EM | Admit: 2021-10-16 | Discharge: 2021-10-16 | Disposition: A | Discharge status: Home / Self Care | Payer: Medicare Other | Source: Home / Self Care | Attending: Emergency Medicine | Admitting: Emergency Medicine

## 2021-10-16 ENCOUNTER — Ambulatory Visit
Admission: RE | Admit: 2021-10-16 | Discharge: 2021-10-16 | Payer: Medicare Other | Attending: Gastroenterology | Admitting: Gastroenterology

## 2021-10-16 DIAGNOSIS — E162 Hypoglycemia, unspecified: Secondary | ICD-10-CM | POA: Insufficient documentation

## 2021-10-16 DIAGNOSIS — Z538 Procedure and treatment not carried out for other reasons: Secondary | ICD-10-CM | POA: Diagnosis not present

## 2021-10-16 DIAGNOSIS — Z1211 Encounter for screening for malignant neoplasm of colon: Secondary | ICD-10-CM | POA: Insufficient documentation

## 2021-10-16 DIAGNOSIS — R001 Bradycardia, unspecified: Secondary | ICD-10-CM | POA: Diagnosis not present

## 2021-10-16 DIAGNOSIS — I10 Essential (primary) hypertension: Secondary | ICD-10-CM | POA: Insufficient documentation

## 2021-10-16 DIAGNOSIS — R000 Tachycardia, unspecified: Secondary | ICD-10-CM

## 2021-10-16 DIAGNOSIS — Z86010 Personal history of colonic polyps: Secondary | ICD-10-CM | POA: Diagnosis present

## 2021-10-16 HISTORY — PX: COLONOSCOPY WITH PROPOFOL: SHX5780

## 2021-10-16 LAB — CBC
HCT: 46 % (ref 39.0–52.0)
Hemoglobin: 16.2 g/dL (ref 13.0–17.0)
MCH: 28.6 pg (ref 26.0–34.0)
MCHC: 35.2 g/dL (ref 30.0–36.0)
MCV: 81.3 fL (ref 80.0–100.0)
Platelets: 183 10*3/uL (ref 150–400)
RBC: 5.66 MIL/uL (ref 4.22–5.81)
RDW: 12.9 % (ref 11.5–15.5)
WBC: 6.6 10*3/uL (ref 4.0–10.5)
nRBC: 0 % (ref 0.0–0.2)

## 2021-10-16 LAB — BASIC METABOLIC PANEL
Anion gap: 15 (ref 5–15)
BUN: 17 mg/dL (ref 8–23)
CO2: 21 mmol/L — ABNORMAL LOW (ref 22–32)
Calcium: 9.1 mg/dL (ref 8.9–10.3)
Chloride: 99 mmol/L (ref 98–111)
Creatinine, Ser: 1.07 mg/dL (ref 0.61–1.24)
GFR, Estimated: >60 mL/min (ref >60)
Glucose, Bld: 66 mg/dL — ABNORMAL LOW (ref 70–99)
Potassium: 5 mmol/L (ref 3.5–5.1)
Sodium: 135 mmol/L (ref 135–145)

## 2021-10-16 LAB — TROPONIN I (HIGH SENSITIVITY): Troponin I (High Sensitivity): 7 ng/L (ref <18)

## 2021-10-16 SURGERY — COLONOSCOPY WITH PROPOFOL
Anesthesia: General | Site: Rectum

## 2021-10-16 MED ORDER — METOPROLOL TARTRATE 5 MG/5ML IV SOLN
INTRAVENOUS | Status: DC | PRN
  Administered 2021-10-16: 2 mg via INTRAVENOUS
  Administered 2021-10-16: 3 mg via INTRAVENOUS

## 2021-10-16 MED ORDER — OXYCODONE HCL 5 MG/5ML PO SOLN: 5.0000 mg | Freq: Once | ORAL | Status: DC | PRN

## 2021-10-16 MED ORDER — OXYCODONE HCL 5 MG PO TABS: 5.0000 mg | ORAL_TABLET | Freq: Once | ORAL | Status: DC | PRN

## 2021-10-16 MED ORDER — SODIUM CHLORIDE 0.9 % IV SOLN: INTRAVENOUS | Status: DC

## 2021-10-16 MED ORDER — LACTATED RINGERS IV SOLN: INTRAVENOUS | Status: DC

## 2021-10-16 SURGICAL SUPPLY — 9 items
FORCEPS BIOP RAD 4 LRG CAP 4 (CUTTING FORCEPS) IMPLANT
GOWN CVR UNV OPN BCK APRN NK (MISCELLANEOUS) ×6 IMPLANT
GOWN ISOL THUMB LOOP REG UNIV (MISCELLANEOUS) ×8
KIT PRC NS LF DISP ENDO (KITS) ×3 IMPLANT
KIT PROCEDURE OLYMPUS (KITS) ×4
MANIFOLD NEPTUNE II (INSTRUMENTS) ×4 IMPLANT
SNARE COLD EXACTO (MISCELLANEOUS) IMPLANT
TRAP ETRAP POLY (MISCELLANEOUS) IMPLANT
WATER STERILE IRR 250ML POUR (IV SOLUTION) ×4 IMPLANT

## 2021-10-16 NOTE — ED Provider Notes (Signed)
? ?[facility] ?Provider Note ? ? ? Event Date/Time  ? First MD Initiated Contact with Patient 10/16/21 1236   ?  (approximate) ? ? ?History  ? ?Tachycardia ? ? ?HPI ? ?Jordan Hughes is a 66 y.o. male with a history of high blood pressure who presents with elevated heart rate.  Patient was preparing for colonoscopy today when anesthesia noted his heart rate was elevated, thought to be sinus tachycardia.  Patient thinks this is related to dehydration.  He feels well now and has no complaints.  No chest pain. ?  ? ? ?Physical Exam  ? ?Triage Vital Signs: ?ED Triage Vitals  ?Enc Vitals Group  ?   BP 10/16/21 1117 (!) 142/78  ?   Pulse Rate 10/16/21 1117 (!) 57  ?   Resp 10/16/21 1117 16  ?   Temp 10/16/21 1117 98.1 ?F (36.7 ?C)  ?   Temp Source 10/16/21 1117 Oral  ?   SpO2 10/16/21 1117 97 %  ?   Weight 10/16/21 1113 65.8 kg (145 lb)  ?   Height 10/16/21 1113 1.676 m ('5\' 6"'$ )  ?   Head Circumference --   ?   Peak Flow --   ?   Pain Score 10/16/21 1113 0  ?   Pain Loc --   ?   Pain Edu? --   ?   Excl. in [place]? --   ? ? ?Most recent vital signs: ?Vitals:  ? 10/16/21 1238 10/16/21 1314  ?BP: 140/70   ?Pulse: (!) 59   ?Resp:    ?Temp:  98 ?F (36.7 ?C)  ?SpO2: 98%   ? ? ? ?General: Awake, no distress.  ?CV:  Good peripheral perfusion.  Normal heart rate, regular rate and rhythm ?Resp:  Normal effort.  CTA bilaterally ?Abd:  No distention.  ?Other:   ? ? ?ED Results / Procedures / Treatments  ? ?Labs ?(all labs ordered are listed, but only abnormal results are displayed) ?Labs Reviewed  ?TROPONIN I (HIGH SENSITIVITY)  ?TROPONIN I (HIGH SENSITIVITY)  ? ? ? ?EKG ? ?ED ECG REPORT ?I, Lavonia Drafts, the attending physician, personally viewed and interpreted this ECG. ? ?Date: 10/16/2021 ? ?Rhythm: normal sinus rhythm ?QRS Axis: normal ?Intervals: normal ?ST/T Wave abnormalities: Nonspecific changes ?Narrative Interpretation: no evidence of acute ischemia ? ? ? ?RADIOLOGY ?Chest x-ray viewed interpreted by  me, no acute abnormality ? ? ? ?PROCEDURES: ? ?Critical Care performed:  ? ?Procedures ? ? ?MEDICATIONS ORDERED IN ED: ?Medications - No data to display ? ? ?IMPRESSION / MDM / ASSESSMENT AND PLAN / ED COURSE  ?I reviewed the triage vital signs and the nursing notes. ? ? ?Patient presents with an episode of tachycardia just prior to colonoscopy.  He was apparently given IV metoprolol and symptoms have resolved.  He reports he never had any symptoms.  No history of arrhythmia. ? ?I suspect he is correct and that dehydration and mild hypoglycemia led to his symptoms.  He is well-appearing here, has received IV fluids, drank some ginger ale and is feeling quite well. ? ?High sensitive troponin is normal.  Lab work otherwise reassuring.  No indication for further evaluation at this time, appropriate for discharge ? ? ? ? ?  ? ? ?FINAL CLINICAL IMPRESSION(S) / ED DIAGNOSES  ? ?Final diagnoses:  ?Tachycardia  ?Hypoglycemia  ? ? ? ?Rx / DC Orders  ? ?ED Discharge Orders   ? ? None  ? ?  ? ? ? ?  Note:  This document was prepared using Dragon voice recognition software and may include unintentional dictation errors. ?  ?Lavonia Drafts, MD ?10/16/21 9450 ? ?

## 2021-10-16 NOTE — Progress Notes (Addendum)
Pt has been SB with occasional PVCs for approximately 20 mins now, no further runs of the tachycardic rhythm noted. Pt remains asymptomatic. Order received for CBC and BMP; labs drawn and sent to med center lab. Decision made to transfer patient to [facility] for further evaluation. Dr. Denton Lank called report to ER physician, Dr. Fonnie Birkenhead. Pt discharged from PACU at approx 1036 via EMS. ?

## 2021-10-16 NOTE — H&P (Signed)
? ?[name], MD [facility] ?[address] ?[place], [place] [address] ?Phone:[phone] ?Fax : [phone] ? ?Primary Care Physician:  Lynnell Jude, MD ?Primary Gastroenterologist:  Dr. Allen Norris ? ?Pre-Procedure History & Physical: ?HPI:  Jordan Hughes is a 66 y.o. male is here for an colonoscopy. ?  ?Past Medical History:  ?Diagnosis Date  ? Arthritis   ? joint pain  ? Gout   ? HOH (hard of hearing)   ? Hypertension   ? controlled on meds  ? Kidney stones   ? ? ?Past Surgical History:  ?Procedure Laterality Date  ? abscess tonsil surgery    ? did not remove  ? APPENDECTOMY    ? CHOLECYSTECTOMY    ? COLONOSCOPY WITH PROPOFOL N/A 05/13/2017  ? Procedure: COLONOSCOPY WITH PROPOFOL;  Surgeon: [name], MD;  Location: [place];  Service: Endoscopy;  Laterality: N/A;  ? EXTRACORPOREAL SHOCK WAVE LITHOTRIPSY Left 06/01/2021  ? Procedure: EXTRACORPOREAL SHOCK WAVE LITHOTRIPSY (ESWL);  Surgeon: Hollice Espy, MD;  Location: [facility];  Service: Urology;  Laterality: Left;  ? LITHOTRIPSY    ? patient states 5 times  ? POLYPECTOMY N/A 05/13/2017  ? Procedure: POLYPECTOMY;  Surgeon: [name], MD;  Location: [place];  Service: Endoscopy;  Laterality: N/A;  ? ? ?Prior to Admission medications   ?Medication Sig Start Date End Date Taking? Authorizing Provider  ?OVER THE COUNTER MEDICATION Herbal system cleansing program from herbalist   Yes [provider]  ?tamsulosin (FLOMAX) 0.4 MG CAPS capsule Take 1 capsule (0.4 mg total) by mouth daily. am 08/29/21  Yes Billey Co, MD  ?triamcinolone (KENALOG) 0.025 % cream APPLY TO AFFECTED AREA TWICE A DAY AS NEEDED 08/22/21  Yes [provider]  ? ? ?Allergies as of 09/25/2021  ? (No Known Allergies)  ? ? ?Family History  ?Problem Relation Age of Onset  ? Bladder Cancer Paternal Uncle   ? Prostate cancer Neg Hx   ? Kidney cancer Neg Hx   ? ? ?Social History  ? ?Socioeconomic History  ? Marital status: Married  ?  Spouse  name: Not on file  ? Number of children: Not on file  ? Years of education: Not on file  ? Highest education level: Not on file  ?Occupational History  ? Not on file  ?Tobacco Use  ? Smoking status: Never  ? Smokeless tobacco: Never  ?Vaping Use  ? Vaping Use: Never used  ?Substance and Sexual Activity  ? Alcohol use: Not Currently  ?  Comment: no alcohol since 2019  ? Drug use: No  ? Sexual activity: Yes  ?  Birth control/protection: None  ?Other Topics Concern  ? Not on file  ?Social History Narrative  ? Not on file  ? ?Social Determinants of Health  ? ?Financial Resource Strain: Not on file  ?Food Insecurity: Not on file  ?Transportation Needs: Not on file  ?Physical Activity: Not on file  ?Stress: Not on file  ?Social Connections: Not on file  ?Intimate Partner Violence: Not on file  ? ? ?Review of Systems: ?See HPI, otherwise negative ROS ? ?Physical Exam: ?BP (!) 143/83   Pulse 61   Temp 97.9 ?F (36.6 ?C) (Temporal)   Resp 20   Ht '5\' 6"'$  (1.676 m)   Wt 66 kg   SpO2 98%   BMI 23.48 kg/m?  ?General:   Alert,  pleasant and cooperative in NAD ?Head:  Normocephalic and atraumatic. ?Neck:  Supple; no masses or  thyromegaly. ?Lungs:  Clear throughout to auscultation.    ?Heart:  Regular rate and rhythm. ?Abdomen:  Soft, nontender and nondistended. Normal bowel sounds, without guarding, and without rebound.   ?Neurologic:  Alert and  oriented x4;  grossly normal neurologically. ? ?Impression/Plan: ?Jordan Hughes is here for an colonoscopy to be performed for a history of adenomatous polyps on 2018 ? ? ?Risks, benefits, limitations, and alternatives regarding  colonoscopy have been reviewed with the patient.  Questions have been answered.  All parties agreeable. ? ? ?[name], MD  10/16/2021, 9:27 AM ?

## 2021-10-16 NOTE — Transfer of Care (Signed)
Immediate Anesthesia Transfer of Care Note ? ?Patient: Jordan Hughes ? ?Procedure(s) Performed: COLONOSCOPY WITH PROPOFOL  procedure aborted ?CANCELLED PROCEDURE (Rectum) ? ?Patient Location: PACU ? ?Anesthesia Type: General ? ?Level of Consciousness: awake, alert  and patient cooperative ? ?Airway and Oxygen Therapy: Patient Spontanous Breathing and Patient connected to supplemental oxygen ? ?Post-op Assessment: Post-op Vital signs reviewed, Patient's Cardiovascular Status Stable, Respiratory Function Stable, Patent Airway and No signs of Nausea or vomiting ? ?Post-op Vital Signs: Reviewed and stable ? ?Complications: No notable events documented. ? ?

## 2021-10-16 NOTE — Anesthesia Postprocedure Evaluation (Signed)
Anesthesia Post Note ? ?Patient: Jordan Hughes ? ?Procedure(s) Performed: none. ?Case cancelled prior to induction. ? ? ?  ?Patient location during evaluation: PACU ?Level of consciousness: awake and alert ?Pain management: pain level controlled ?Vital Signs Assessment: post-procedure vital signs reviewed and stable ?Respiratory status: spontaneous breathing, nonlabored ventilation, respiratory function stable and patient connected to nasal cannula oxygen ?Cardiovascular status: blood pressure returned to baseline and stable ?Postop Assessment: no apparent nausea or vomiting ?Anesthetic complications: no ? ? ?No notable events documented. ? ?Fidel Levy ? ? ? ?Pt arrived at [facility] for colonoscopy with Dr. Allen Norris. ?PreOp VSS. ? ?Intraop before induction, rhythm monitors showed HR=130-150s. ?Pt denied CP/SOB.  Rhythm appeared to be STach. ? ?IV Metoprolol given to pt.  HR reduced, but with intermittent runs of STach. ? ?D/w with GI doc and case was cancelled. ? ?In PACU, VSS. EKG shows SBrady.  PT denies CP or SOB. ?CBC and BMP sent. ? ?Dr. Allen Norris d/w with PCP (Dr. Clemmie Krill), who recommended ER if HR increased. ? ?Pt transported via Ambulance to [facility]. ? ?D/w with Aldona Bar (ER charge RN), who will relay info to  Dr. Lollie Marrow. ? ?

## 2021-10-16 NOTE — Progress Notes (Signed)
Pt arrived at [facility] for colonoscopy with Dr. Allen Norris. ?PreOp VSS. ? ?Intraop before induction, rhythm monitors showed HR=130-150s. ?Pt denied CP/SOB.  Rhythm appeared to be STach. ? ?IV Metoprolol given to pt.  HR reduced, but with intermittent runs of STach. ? ?D/w with GI doc and case was cancelled. ? ?In PACU, VSS. EKG shows SBrady.  PT denies CP or SOB. ?CBC and BMP sent. ? ?Dr. Allen Norris d/w with PCP (Dr. Clemmie Krill), who recommended ER if HR increased. ? ?Pt transported via Ambulance to [facility]. ? ?D/w with Aldona Bar (ER charge RN), who will relay info to  Dr. Lollie Marrow. ? ? ?

## 2021-10-16 NOTE — ED Notes (Signed)
Pt brought in by [facility] from [facility] surgery center, was there for colonoscopy. They noted pt to have episodes of tachycardia per monitor, was given 5 mg of metoprolol per staff. EMS states HR has been wnl during transport.  ?

## 2021-10-16 NOTE — ED Notes (Signed)
See triage note    states he was in OR at surgery center having a colonoscopy done   states he was told that he had several episodes of irreg h/r  on arrival NSR  denies any pain  ?

## 2021-10-16 NOTE — ED Triage Notes (Signed)
Pt via EMS from home. Per EMS, pt HR was in the 150s. Pt was suppose to get a colonoscopy. And they called EMS because his HR was in the 150s. HR is 51 now. Denies pain. Denies SOB. Pt is A&OX4 and NAD ? ?EMS gave '5mg'$  of Metoprolol.  ?

## 2021-10-16 NOTE — ED Notes (Signed)
See triage note. Pt BIB EMS from surgery center for periods of elevated HR while on the monitor. Pts HR WNL during EMS transport & is WNL on arrival to ER. Pt denies feeling his heart racing. ?

## 2021-10-16 NOTE — Progress Notes (Signed)
Per report from CRNA, when placed on monitors prior to procedure start, tachycardic rhythm noted, metoprolol given. Procedure was aborted. Upon arrival to to PACU, SB initially noted on monitor. Within a few mins in PACU, several intermittent runs of a tachycardic rhythm noted on monitor. See tele strips. Pt denies chest pain, shortness of breath, or dizziness. Dr. Denton Lank informed, order received for 12-lead EKG. 12-lead showed SB.  ?

## 2021-10-16 NOTE — Anesthesia Preprocedure Evaluation (Signed)
Anesthesia Evaluation  ?Patient identified by MRN, date of birth, ID band ?Patient awake ? ? ? ?Reviewed: ?NPO status  ? ?History of Anesthesia Complications ?Negative for: history of anesthetic complications ? ?Airway ?Mallampati: II ? ?TM Distance: >3 FB ?Neck ROM: full ? ? ? Dental ?no notable dental hx. ? ?  ?Pulmonary ?neg pulmonary ROS,  ?  ?Pulmonary exam normal ? ? ? ? ? ? ? Cardiovascular ?Exercise Tolerance: Good ?negative cardio ROS ?Normal cardiovascular exam ? ? ?  ?Neuro/Psych ?negative neurological ROS ? negative psych ROS  ? GI/Hepatic ?negative GI ROS, Neg liver ROS,   ?Endo/Other  ?negative endocrine ROS ? Renal/GU ?negative Renal ROS  ? ?bph and renal stones ? ?  ?Musculoskeletal ? ?(+) Arthritis , Rheumatoid disorders,   ? Abdominal ?  ?Peds ? Hematology ?negative hematology ROS ?(+)   ?Anesthesia Other Findings ? ? Reproductive/Obstetrics ? ?  ? ? ? ? ? ? ? ? ? ? ? ? ? ?  ?  ? ? ? ? ? ? ? ? ?Anesthesia Physical ?Anesthesia Plan ? ?ASA: 2 ? ?Anesthesia Plan: General  ? ?Post-op Pain Management:   ? ?Induction:  ? ?PONV Risk Score and Plan: 2 and TIVA and Propofol infusion ? ?Airway Management Planned:  ? ?Additional Equipment:  ? ?Intra-op Plan:  ? ?Post-operative Plan:  ? ?Informed Consent: I have reviewed the patients History and Physical, chart, labs and discussed the procedure including the risks, benefits and alternatives for the proposed anesthesia with the patient or authorized representative who has indicated his/her understanding and acceptance.  ? ? ? ? ? ?Plan Discussed with: CRNA ? ?Anesthesia Plan Comments:   ? ? ? ? ? ? ?Anesthesia Quick Evaluation ? ?

## 2021-10-17 ENCOUNTER — Encounter: Payer: Self-pay | Admitting: Gastroenterology

## 2022-01-12 ENCOUNTER — Other Ambulatory Visit: Payer: Self-pay | Admitting: Family Medicine

## 2022-01-12 ENCOUNTER — Ambulatory Visit
Admission: RE | Admit: 2022-01-12 | Discharge: 2022-01-12 | Disposition: A | Discharge status: Home / Self Care | Payer: Medicare Other | Source: Ambulatory Visit | Attending: Family Medicine | Admitting: Family Medicine

## 2022-01-12 DIAGNOSIS — R2242 Localized swelling, mass and lump, left lower limb: Secondary | ICD-10-CM

## 2022-08-29 ENCOUNTER — Encounter: Payer: Self-pay | Admitting: Gastroenterology

## 2022-08-30 ENCOUNTER — Encounter: Payer: Self-pay | Admitting: Gastroenterology

## 2022-09-03 ENCOUNTER — Telehealth: Payer: Self-pay | Admitting: Gastroenterology

## 2022-09-03 ENCOUNTER — Other Ambulatory Visit: Payer: Self-pay

## 2022-09-03 ENCOUNTER — Telehealth: Payer: Self-pay

## 2022-09-03 DIAGNOSIS — Z800 Family history of malignant neoplasm of digestive organs: Secondary | ICD-10-CM

## 2022-09-03 DIAGNOSIS — Z86010 Personal history of colonic polyps: Secondary | ICD-10-CM

## 2022-09-03 MED ORDER — NA SULFATE-K SULFATE-MG SULF 17.5-3.13-1.6 GM/177ML PO SOLN: 1.0000 | Freq: Once | ORAL | 0 refills | Status: AC

## 2022-09-03 NOTE — Telephone Encounter (Signed)
Patient called to schedule colonoscopy. Requesting call back.

## 2022-09-03 NOTE — Telephone Encounter (Signed)
Colonoscopy schedule for 09/24/2022

## 2022-09-03 NOTE — Telephone Encounter (Signed)
Gastroenterology Pre-Procedure Review  Request Date: 09/24/22 Requesting Physician: Dr. Allen Norris  PATIENT REVIEW QUESTIONS: The patient responded to the following health history questions as indicated:    1. Are you having any GI issues?  Pt states GI issues last 10 years but nothing unusual 2. Do you have a personal history of Polyps? yes (05/13/2017) 3. Do you have a family history of Colon Cancer or Polyps? Mother and brother colon cancer 43. Diabetes Mellitus? no 5. Joint replacements in the past 12 months?no 6. Major health problems in the past 3 months?no 7. Any artificial heart valves, MVP, or defibrillator?no    MEDICATIONS & ALLERGIES:    Patient reports the following regarding taking any anticoagulation/antiplatelet therapy:   Plavix, Coumadin, Eliquis, Xarelto, Lovenox, Pradaxa, Brilinta, or Effient? no Aspirin? no  Patient confirms/reports the following medications:  Current Outpatient Medications  Medication Sig Dispense Refill   OVER THE COUNTER MEDICATION Herbal system cleansing program from herbalist     tamsulosin (FLOMAX) 0.4 MG CAPS capsule Take 1 capsule (0.4 mg total) by mouth daily. am 90 capsule 3   triamcinolone (KENALOG) 0.025 % cream APPLY TO AFFECTED AREA TWICE A DAY AS NEEDED     No current facility-administered medications for this visit.    Patient confirms/reports the following allergies:  No Known Allergies  No orders of the defined types were placed in this encounter.   AUTHORIZATION INFORMATION Primary Insurance: 1D#: Group #:  Secondary Insurance: 1D#: Group #:  SCHEDULE INFORMATION: Date: 09/24/22 Time: Location: [facility]

## 2022-09-04 ENCOUNTER — Encounter: Payer: Self-pay | Admitting: Urology

## 2022-09-04 ENCOUNTER — Ambulatory Visit: Payer: Medicare Other | Admitting: Urology

## 2022-09-04 ENCOUNTER — Ambulatory Visit
Admission: RE | Admit: 2022-09-04 | Discharge: 2022-09-04 | Disposition: A | Discharge status: Home / Self Care | Payer: Medicare Other | Attending: Urology | Admitting: Urology

## 2022-09-04 ENCOUNTER — Ambulatory Visit
Admission: RE | Admit: 2022-09-04 | Discharge: 2022-09-04 | Disposition: A | Discharge status: Home / Self Care | Payer: Medicare Other | Source: Ambulatory Visit | Attending: Urology | Admitting: Urology

## 2022-09-04 ENCOUNTER — Other Ambulatory Visit: Payer: Self-pay | Admitting: *Deleted

## 2022-09-04 VITALS — BP 146/91 | HR 64 | Ht 66.0 in | Wt 140.0 lb

## 2022-09-04 DIAGNOSIS — N200 Calculus of kidney: Secondary | ICD-10-CM

## 2022-09-04 DIAGNOSIS — N4283 Cyst of prostate: Secondary | ICD-10-CM | POA: Diagnosis not present

## 2022-09-04 DIAGNOSIS — N138 Other obstructive and reflux uropathy: Secondary | ICD-10-CM

## 2022-09-04 NOTE — Progress Notes (Signed)
   09/04/2022 3:07 PM   Jordan Hughes 1955-07-12 [id number]  Reason for visit: Follow up history of nephrolithiasis, prostate cyst  HPI: 67 year old male here for routine follow-up of kidney stones.  He has an extensive history of stones previously requiring shockwave lithotripsy multiple times.  Most recent kidney stone requiring surgery was a 7 mm left mid ureteral stone treated with shockwave lithotripsy with Dr. Erlene Quan in December 2022.  He reports a stone episode around Christmas, as well as another stone on the right side about 1 month ago, denies any pain in the last 2 weeks.  I personally viewed and interpreted his KUB today that shows a stable 2 mm right midpole stone, no other evidence of ureteral stones.  He also has a long history of a prostate cyst, this has been stable on CT, and further evaluation with MRI in August 2021 showed a benign-appearing 2.7 cm prostatic cyst in the right peripheral zone and prostate measured 55 g.  PSA has been normal, most recently 1.4 in 2021.  He has some baseline urinary symptoms of urinary frequency and some weak stream, he is on Flomax through PCP which improves his symptoms.  We discussed general stone prevention strategies including adequate hydration with goal of producing 2.5 L of urine daily, increasing citric acid intake, increasing calcium intake during high oxalate meals, minimizing animal protein, and decreasing salt intake. Information about dietary recommendations given today.   He would like to follow-up as needed at this point, consider CT if develops flank pain in the future with his long history of kidney stones requiring intervention   Billey Co, [place] [address], [place], [place] [address] [phone]

## 2022-09-07 IMAGING — CR DG ABDOMEN 1V
2 series · 2 of 2 positions shown · non-contrast
Comparison: 06/01/2021

CLINICAL DATA: Left ureteral calculus

EXAM:
ABDOMEN - 1 VIEW

[abdomen kub (1 of 2)]
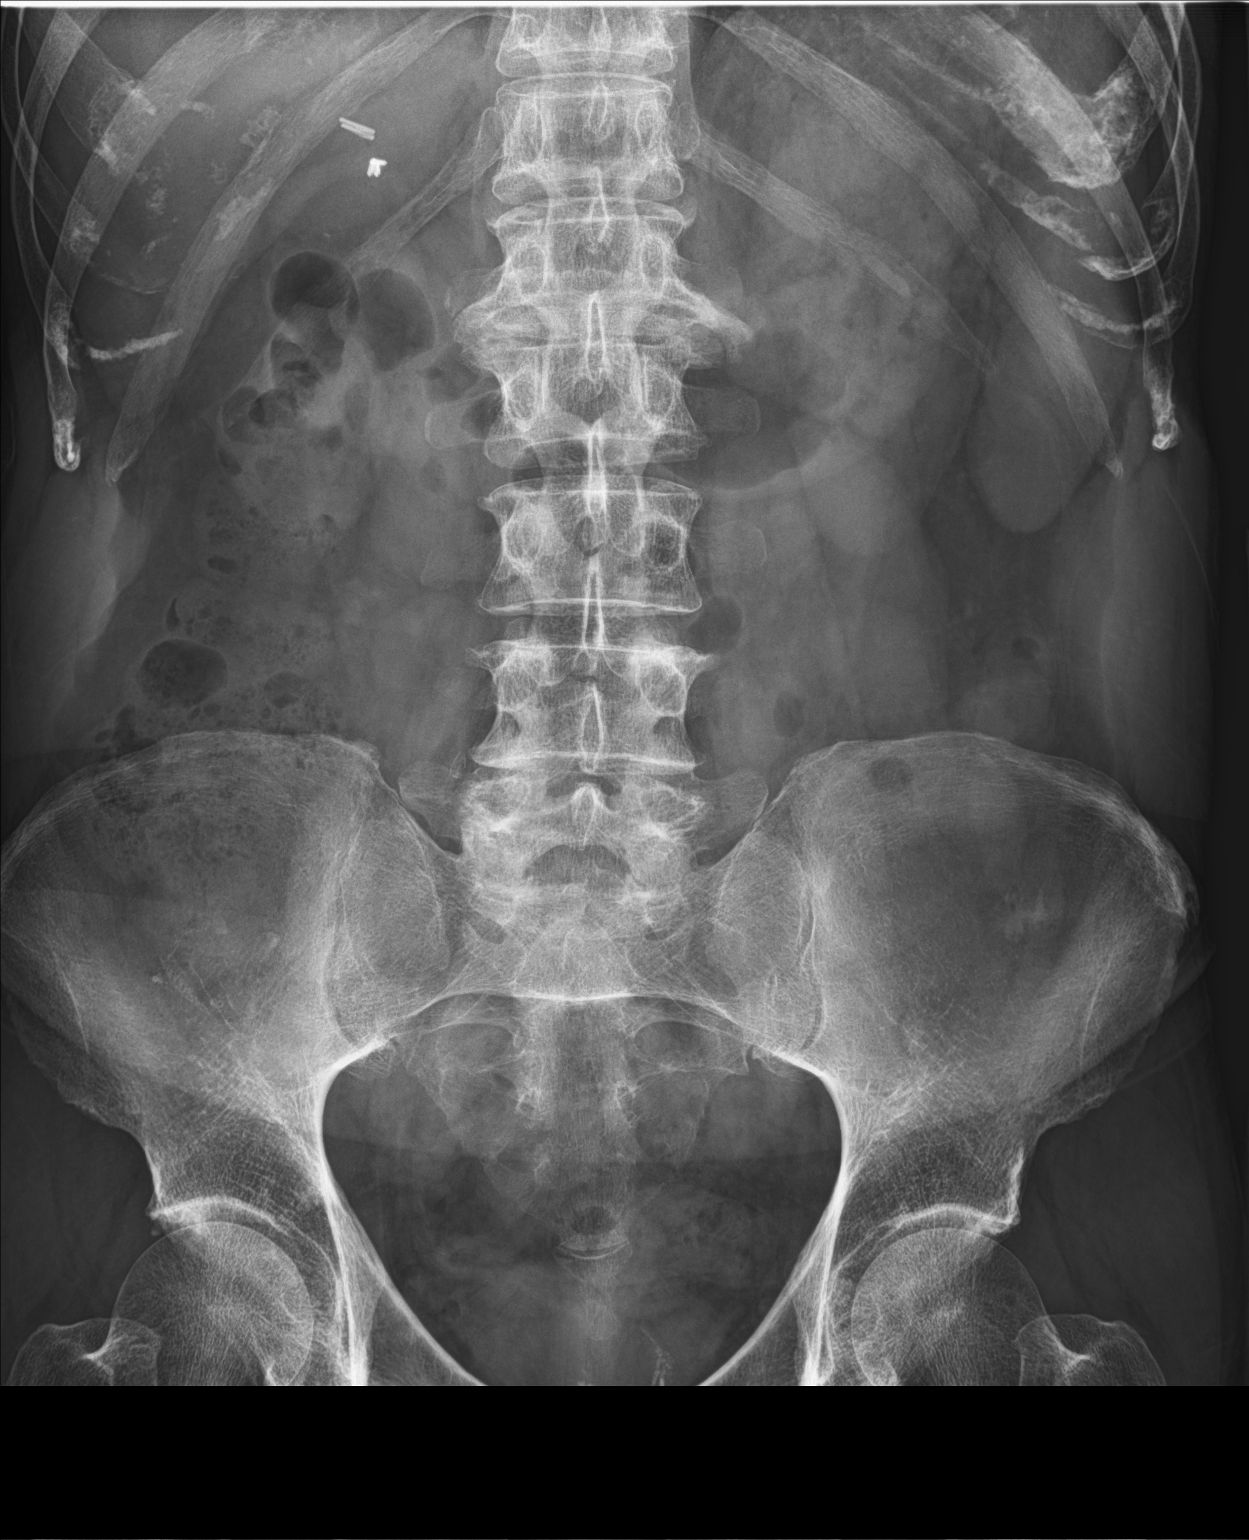

[abdomen kub (2 of 2)]
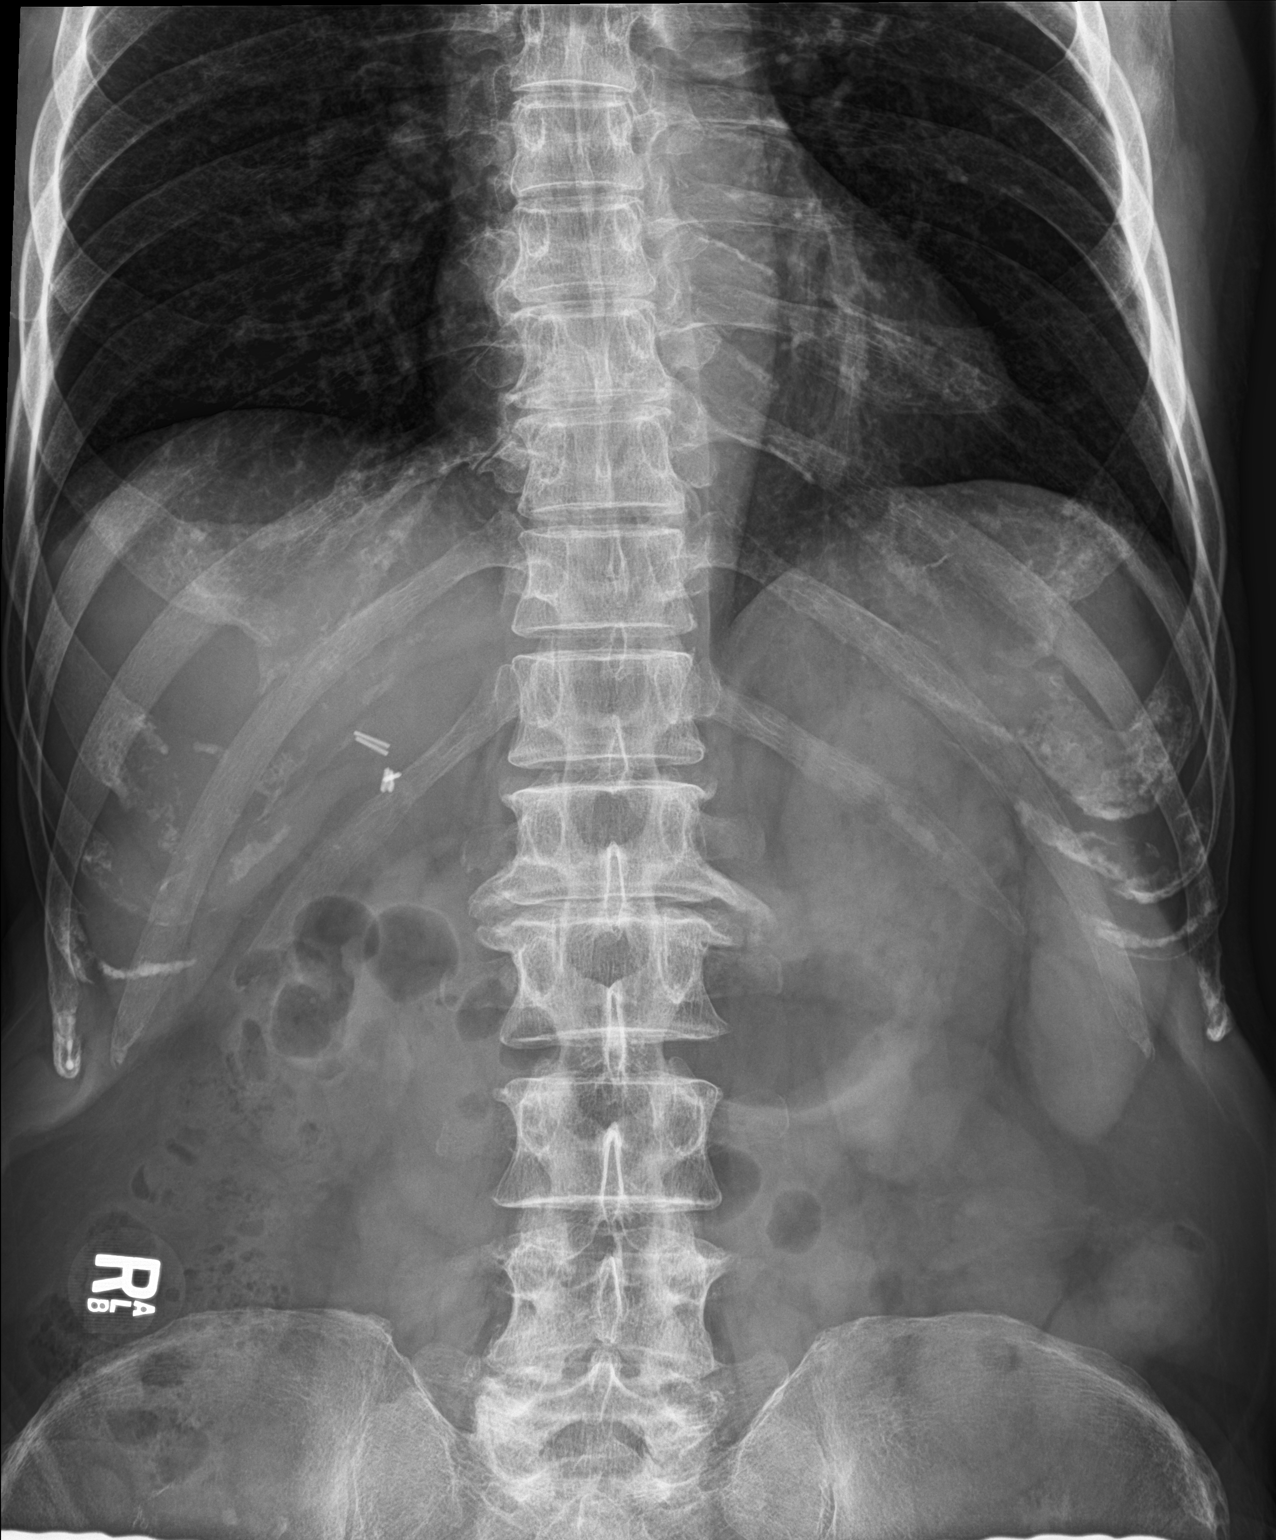

[2 of 2 positions shown; findings below may reference images not displayed]

FINDINGS: Previously noted left ureteral calculus is no longer visualized,.
Punctate 1-2 mm calculi overlie the upper and lower pole of the
right kidney. No additional renal or ureteral calculi. No
organomegaly. Cholecystectomy clips are seen in the right upper
quadrant.
IMPRESSION: Resolution of left urolithiasis.

Minimal right nephrolithiasis.

## 2022-09-13 ENCOUNTER — Encounter: Payer: Self-pay | Admitting: Gastroenterology

## 2022-09-24 ENCOUNTER — Ambulatory Visit: Payer: Medicare Other | Admitting: Anesthesiology

## 2022-09-24 ENCOUNTER — Encounter
Admission: RE | Disposition: A | Discharge status: Home / Self Care | Payer: Self-pay | Source: Home / Self Care | Attending: Gastroenterology

## 2022-09-24 ENCOUNTER — Other Ambulatory Visit: Payer: Self-pay

## 2022-09-24 ENCOUNTER — Ambulatory Visit
Admission: RE | Admit: 2022-09-24 | Discharge: 2022-09-24 | Disposition: A | Discharge status: Home / Self Care | Payer: Medicare Other | Attending: Gastroenterology | Admitting: Gastroenterology

## 2022-09-24 ENCOUNTER — Encounter: Payer: Self-pay | Admitting: Gastroenterology

## 2022-09-24 DIAGNOSIS — K5730 Diverticulosis of large intestine without perforation or abscess without bleeding: Secondary | ICD-10-CM | POA: Diagnosis not present

## 2022-09-24 DIAGNOSIS — Z1211 Encounter for screening for malignant neoplasm of colon: Secondary | ICD-10-CM | POA: Diagnosis not present

## 2022-09-24 DIAGNOSIS — D123 Benign neoplasm of transverse colon: Secondary | ICD-10-CM | POA: Diagnosis not present

## 2022-09-24 DIAGNOSIS — K635 Polyp of colon: Secondary | ICD-10-CM

## 2022-09-24 DIAGNOSIS — Z09 Encounter for follow-up examination after completed treatment for conditions other than malignant neoplasm: Secondary | ICD-10-CM | POA: Diagnosis not present

## 2022-09-24 DIAGNOSIS — I10 Essential (primary) hypertension: Secondary | ICD-10-CM | POA: Insufficient documentation

## 2022-09-24 DIAGNOSIS — Z860100 Personal history of colon polyps, unspecified: Secondary | ICD-10-CM

## 2022-09-24 DIAGNOSIS — K640 First degree hemorrhoids: Secondary | ICD-10-CM | POA: Insufficient documentation

## 2022-09-24 DIAGNOSIS — Z800 Family history of malignant neoplasm of digestive organs: Secondary | ICD-10-CM

## 2022-09-24 HISTORY — PX: COLONOSCOPY WITH PROPOFOL: SHX5780

## 2022-09-24 HISTORY — DX: Tachycardia, unspecified: R00.0

## 2022-09-24 HISTORY — PX: POLYPECTOMY: SHX5525

## 2022-09-24 SURGERY — COLONOSCOPY WITH PROPOFOL
Anesthesia: General | Site: Rectum

## 2022-09-24 MED ORDER — STERILE WATER FOR IRRIGATION IR SOLN
Status: DC | PRN
  Administered 2022-09-24: .05 mL

## 2022-09-24 MED ORDER — PROPOFOL 10 MG/ML IV BOLUS
INTRAVENOUS | Status: DC | PRN
  Administered 2022-09-24: 20 mg via INTRAVENOUS
  Administered 2022-09-24: 30 mg via INTRAVENOUS
  Administered 2022-09-24: 20 mg via INTRAVENOUS
  Administered 2022-09-24: 100 mg via INTRAVENOUS

## 2022-09-24 MED ORDER — SODIUM CHLORIDE 0.9 % IV SOLN: INTRAVENOUS | Status: DC

## 2022-09-24 MED ORDER — STERILE WATER FOR IRRIGATION IR SOLN
Status: DC | PRN
  Administered 2022-09-24: 50 mL

## 2022-09-24 MED ORDER — LACTATED RINGERS IV SOLN: INTRAVENOUS | Status: DC

## 2022-09-24 MED ORDER — LIDOCAINE HCL (CARDIAC) PF 100 MG/5ML IV SOSY
PREFILLED_SYRINGE | INTRAVENOUS | Status: DC | PRN
  Administered 2022-09-24: 60 mg via INTRAVENOUS

## 2022-09-24 SURGICAL SUPPLY — 8 items
GOWN CVR UNV OPN BCK APRN NK (MISCELLANEOUS) ×4 IMPLANT
GOWN ISOL THUMB LOOP REG UNIV (MISCELLANEOUS) ×4
KIT PRC NS LF DISP ENDO (KITS) ×2 IMPLANT
KIT PROCEDURE OLYMPUS (KITS) ×2
MANIFOLD NEPTUNE II (INSTRUMENTS) ×2 IMPLANT
SNARE COLD EXACTO (MISCELLANEOUS) IMPLANT
TRAP ETRAP POLY (MISCELLANEOUS) IMPLANT
WATER STERILE IRR 250ML POUR (IV SOLUTION) ×2 IMPLANT

## 2022-09-24 NOTE — H&P (Signed)
[name], MD [facility] [address] [place], [place] [address] Phone:[phone] Fax : [phone]  Primary Care Physician:  Lynnell Jude, MD Primary Gastroenterologist:  Dr. Allen Norris  Pre-Procedure History & Physical: HPI:  Jordan Hughes is a 67 y.o. male is here for an colonoscopy.   Past Medical History:  Diagnosis Date   Arthritis    joint pain   Gout    HOH (hard of hearing)    Hypertension    controlled on meds   Kidney stones    Tachycardia     Past Surgical History:  Procedure Laterality Date   abscess tonsil surgery     did not remove   APPENDECTOMY     CHOLECYSTECTOMY     COLONOSCOPY WITH PROPOFOL N/A 05/13/2017   Procedure: COLONOSCOPY WITH PROPOFOL;  Surgeon: [name], MD;  Location: [place];  Service: Endoscopy;  Laterality: N/A;   COLONOSCOPY WITH PROPOFOL  10/16/2021   Procedure: COLONOSCOPY WITH PROPOFOL  procedure aborted;  Surgeon: [name], MD;  Location: [place];  Service: Endoscopy;;   EXTRACORPOREAL SHOCK WAVE LITHOTRIPSY Left 06/01/2021   Procedure: EXTRACORPOREAL SHOCK WAVE LITHOTRIPSY (ESWL);  Surgeon: Hollice Espy, MD;  Location: [facility];  Service: Urology;  Laterality: Left;   LITHOTRIPSY     patient states 5 times   POLYPECTOMY N/A 05/13/2017   Procedure: POLYPECTOMY;  Surgeon: [name], MD;  Location: [place];  Service: Endoscopy;  Laterality: N/A;    Prior to Admission medications   Medication Sig Start Date End Date Taking? Authorizing Provider  Na Sulfate-K Sulfate-Mg Sulf 17.5-3.13-1.6 GM/177ML SOLN Take by mouth. 09/03/22  Yes [provider]  tamsulosin (FLOMAX) 0.4 MG CAPS capsule Take 1 capsule (0.4 mg total) by mouth daily. am 08/29/21  Yes Billey Co, MD  triamcinolone (KENALOG) 0.025 % cream APPLY TO AFFECTED AREA TWICE A DAY AS NEEDED 08/22/21  Yes [provider]    Allergies as of 09/03/2022   (No Known Allergies)    Family History   Problem Relation Age of Onset   Bladder Cancer Paternal Uncle    Prostate cancer Neg Hx    Kidney cancer Neg Hx     Social History   Socioeconomic History   Marital status: Married    Spouse name: Not on file   Number of children: Not on file   Years of education: Not on file   Highest education level: Not on file  Occupational History   Not on file  Tobacco Use   Smoking status: Never   Smokeless tobacco: Never  Vaping Use   Vaping Use: Never used  Substance and Sexual Activity   Alcohol use: Not Currently    Comment: no alcohol since 2019   Drug use: No   Sexual activity: Yes    Birth control/protection: None  Other Topics Concern   Not on file  Social History Narrative   Not on file   Social Determinants of Health   Financial Resource Strain: Not on file  Food Insecurity: Not on file  Transportation Needs: Not on file  Physical Activity: Not on file  Stress: Not on file  Social Connections: Not on file  Intimate Partner Violence: Not on file    Review of Systems: See HPI, otherwise negative ROS  Physical Exam: BP (!) 141/90   Pulse 78   Temp 98.4 F (36.9 C) (Temporal)   Resp 15   Ht 5\' 6"  (1.676 m)   Wt  60.8 kg   SpO2 97%   BMI 21.63 kg/m  General:   Alert,  pleasant and cooperative in NAD Head:  Normocephalic and atraumatic. Neck:  Supple; no masses or thyromegaly. Lungs:  Clear throughout to auscultation.    Heart:  Regular rate and rhythm. Abdomen:  Soft, nontender and nondistended. Normal bowel sounds, without guarding, and without rebound.   Neurologic:  Alert and  oriented x4;  grossly normal neurologically.  Impression/Plan: Jordan Hughes is here for an colonoscopy to be performed for a history of adenomatous polyps on 2018   Risks, benefits, limitations, and alternatives regarding  colonoscopy have been reviewed with the patient.  Questions have been answered.  All parties agreeable.   [name], MD  09/24/2022, 10:00 AM

## 2022-09-24 NOTE — Anesthesia Preprocedure Evaluation (Signed)
Anesthesia Evaluation  Patient identified by MRN, date of birth, ID band Patient awake    Reviewed: Allergy & Precautions, NPO status , Patient's Chart, lab work & pertinent test results  Airway Mallampati: III  TM Distance: >3 FB Neck ROM: full    Dental  (+) Chipped, Dental Advidsory Given   Pulmonary neg pulmonary ROS   Pulmonary exam normal        Cardiovascular hypertension, negative cardio ROS Normal cardiovascular exam Rhythm:regular Rate:Normal     Neuro/Psych negative neurological ROS  negative psych ROS   GI/Hepatic negative GI ROS, Neg liver ROS,,,  Endo/Other  negative endocrine ROS    Renal/GU negative Renal ROS  negative genitourinary   Musculoskeletal   Abdominal   Peds  Hematology negative hematology ROS (+)   Anesthesia Other Findings Patient was here 4/23 for a colonoscopy. Patient had a high HR in the 130-150s. A 12 lead EKG was done and his case was canceled. Patient saw his cardiologist who did a echo. Echo was within normal limits. Patient is back today for his colonoscopy. Patient states he feels great and has no problems.    Past Medical History: No date: Arthritis     Comment:  joint pain No date: Gout No date: HOH (hard of hearing) No date: Hypertension     Comment:  controlled on meds No date: Kidney stones No date: Tachycardia  Past Surgical History: No date: abscess tonsil surgery     Comment:  did not remove No date: APPENDECTOMY No date: CHOLECYSTECTOMY 05/13/2017: COLONOSCOPY WITH PROPOFOL; N/A     Comment:  Procedure: COLONOSCOPY WITH PROPOFOL;  Surgeon: [name], MD;  Location: [place];  Service:               Endoscopy;  Laterality: N/A; 10/16/2021: COLONOSCOPY WITH PROPOFOL     Comment:  Procedure: COLONOSCOPY WITH PROPOFOL  procedure aborted;              Surgeon: [name], MD;  Location: [place];  Service:  Endoscopy;; 06/01/2021: EXTRACORPOREAL SHOCK WAVE LITHOTRIPSY; Left     Comment:  Procedure: EXTRACORPOREAL SHOCK WAVE LITHOTRIPSY (ESWL);              Surgeon: Hollice Espy, MD;  Location: [facility];                Service: Urology;  Laterality: Left; No date: LITHOTRIPSY     Comment:  patient states 5 times 05/13/2017: POLYPECTOMY; N/A     Comment:  Procedure: POLYPECTOMY;  Surgeon: [name], MD;                Location: [place];  Service: Endoscopy;                Laterality: N/A;  BMI    Body Mass Index: 21.63 kg/m      Reproductive/Obstetrics negative OB ROS                             Anesthesia Physical Anesthesia Plan  ASA: 2  Anesthesia Plan: General   Post-op Pain Management: Minimal or no pain anticipated   Induction: Intravenous  PONV Risk Score and Plan: 3 and Propofol infusion, TIVA and Ondansetron  Airway Management Planned: Nasal Cannula  Additional Equipment:  None  Intra-op Plan:   Post-operative Plan:   Informed Consent: I have reviewed the patients History and Physical, chart, labs and discussed the procedure including the risks, benefits and alternatives for the proposed anesthesia with the patient or authorized representative who has indicated his/her understanding and acceptance.     Dental advisory given  Plan Discussed with: CRNA and Surgeon  Anesthesia Plan Comments: (Discussed risks of anesthesia with patient, including possibility of difficulty with spontaneous ventilation under anesthesia necessitating airway intervention, PONV, and rare risks such as cardiac or respiratory or neurological events, and allergic reactions. Discussed the role of CRNA in patient's perioperative care. Patient understands.)       Anesthesia Quick Evaluation

## 2022-09-24 NOTE — Transfer of Care (Signed)
Immediate Anesthesia Transfer of Care Note  Patient: Jordan Hughes  Procedure(s) Performed: COLONOSCOPY WITH PROPOFOL (Rectum) POLYPECTOMY (Rectum)  Patient Location: PACU  Anesthesia Type: General  Level of Consciousness: awake, alert  and patient cooperative  Airway and Oxygen Therapy: Patient Spontanous Breathing and Patient connected to supplemental oxygen  Post-op Assessment: Post-op Vital signs reviewed, Patient's Cardiovascular Status Stable, Respiratory Function Stable, Patent Airway and No signs of Nausea or vomiting  Post-op Vital Signs: Reviewed and stable  Complications: No notable events documented.

## 2022-09-24 NOTE — Addendum Note (Signed)
Addendum  created 09/24/22 1058 by Dimas Millin, MD   [place] recorded in [place], [place] filed

## 2022-09-24 NOTE — Anesthesia Postprocedure Evaluation (Signed)
Anesthesia Post Note  Patient: Jordan Hughes  Procedure(s) Performed: COLONOSCOPY WITH PROPOFOL (Rectum) POLYPECTOMY (Rectum)  Patient location during evaluation: PACU Anesthesia Type: General Level of consciousness: awake and alert Pain management: pain level controlled Vital Signs Assessment: post-procedure vital signs reviewed and stable Respiratory status: spontaneous breathing, nonlabored ventilation, respiratory function stable and patient connected to nasal cannula oxygen Cardiovascular status: blood pressure returned to baseline and stable Postop Assessment: no apparent nausea or vomiting Anesthetic complications: no  No notable events documented.   Last Vitals:  Vitals:   09/24/22 1024 09/24/22 1036  BP: 108/74 111/75  Pulse: 71 69  Resp: (!) 22 18  Temp: 36.7 C 36.7 C  SpO2: 97% 99%    Last Pain:  Vitals:   09/24/22 1036  TempSrc:   PainSc: 0-No pain                 Dimas Millin

## 2022-09-24 NOTE — Op Note (Signed)
[facility] Patient Name: Jordan Hughes Procedure Date: 09/24/2022 9:54 AM MRN: [id number] Account #: [id number] Date of Birth: 01-19-1956 Admit Type: Outpatient Age: 67 Room: [facility] OR ROOM 01 Gender: Male Note Status: Finalized Instrument Name: [id number] Procedure:             Colonoscopy Indications:           High risk colon cancer surveillance: Personal history                         of colonic polyps Providers:             [name] MD, MD Referring MD:          Lynnell Jude (Referring MD) Medicines:             Propofol per Anesthesia Complications:         No immediate complications. Procedure:             Pre-Anesthesia Assessment:                        - Prior to the procedure, a History and Physical was                         performed, and patient medications and allergies were                         reviewed. The patient's tolerance of previous                         anesthesia was also reviewed. The risks and benefits                         of the procedure and the sedation options and risks                         were discussed with the patient. All questions were                         answered, and informed consent was obtained. Prior                         Anticoagulants: The patient has taken no anticoagulant                         or antiplatelet agents. ASA Grade Assessment: II - A                         patient with mild systemic disease. After reviewing                         the risks and benefits, the patient was deemed in                         satisfactory condition to undergo the procedure.                        After obtaining informed consent, the colonoscope was  passed under direct vision. Throughout the procedure,                         the patient's blood pressure, pulse, and oxygen                         saturations were monitored continuously. The                          Colonoscope was introduced through the anus and                         advanced to the the cecum, identified by appendiceal                         orifice and ileocecal valve. The colonoscopy was                         performed without difficulty. The patient tolerated                         the procedure well. The quality of the bowel                         preparation was excellent. Findings:      The perianal and digital rectal examinations were normal.      A 4 mm polyp was found in the transverse colon. The polyp was sessile.       The polyp was removed with a cold snare. Resection and retrieval were       complete.      A 4 mm polyp was found in the descending colon. The polyp was sessile.       The polyp was removed with a cold snare. Resection and retrieval were       complete.      Multiple small-mouthed diverticula were found in the sigmoid colon.      Non-bleeding internal hemorrhoids were found during retroflexion. The       hemorrhoids were Grade I (internal hemorrhoids that do not prolapse). Impression:            - One 4 mm polyp in the transverse colon, removed with                         a cold snare. Resected and retrieved.                        - One 4 mm polyp in the descending colon, removed with                         a cold snare. Resected and retrieved.                        - Diverticulosis in the sigmoid colon.                        - Non-bleeding internal hemorrhoids. Recommendation:        - Discharge patient to home.                        -  Resume previous diet.                        - Continue present medications.                        - Await pathology results.                        - Repeat colonoscopy in 7 years for surveillance. Procedure Code(s):     --- Professional ---                        [phone], Colonoscopy, flexible; with removal of                         tumor(s), polyp(s), or other lesion(s) by snare                          technique Diagnosis Code(s):     --- Professional ---                        Z86.010, Personal history of colonic polyps                        D12.4, Benign neoplasm of descending colon CPT copyright 2022 American Medical Association. All rights reserved. The codes documented in this report are preliminary and upon coder review may  be revised to meet current compliance requirements. [name] MD, MD 09/24/2022 10:22:10 AM This report has been signed electronically. Number of Addenda: 0 Note Initiated On: 09/24/2022 9:54 AM Scope Withdrawal Time: 0 hours 7 minutes 49 seconds  Total Procedure Duration: 0 hours 12 minutes 27 seconds  Estimated Blood Loss:  Estimated blood loss: none.      [facility]

## 2022-09-25 ENCOUNTER — Encounter: Payer: Self-pay | Admitting: Gastroenterology

## 2022-09-26 LAB — SURGICAL PATHOLOGY

## 2022-09-27 ENCOUNTER — Encounter: Payer: Self-pay | Admitting: Gastroenterology

## 2022-10-25 IMAGING — CR DG CHEST 2V
2 series · 2 of 2 positions shown · non-contrast
Comparison: December 09, 2019.

CLINICAL DATA: Tachycardia.

EXAM:
CHEST - 2 VIEW

[chest pa]
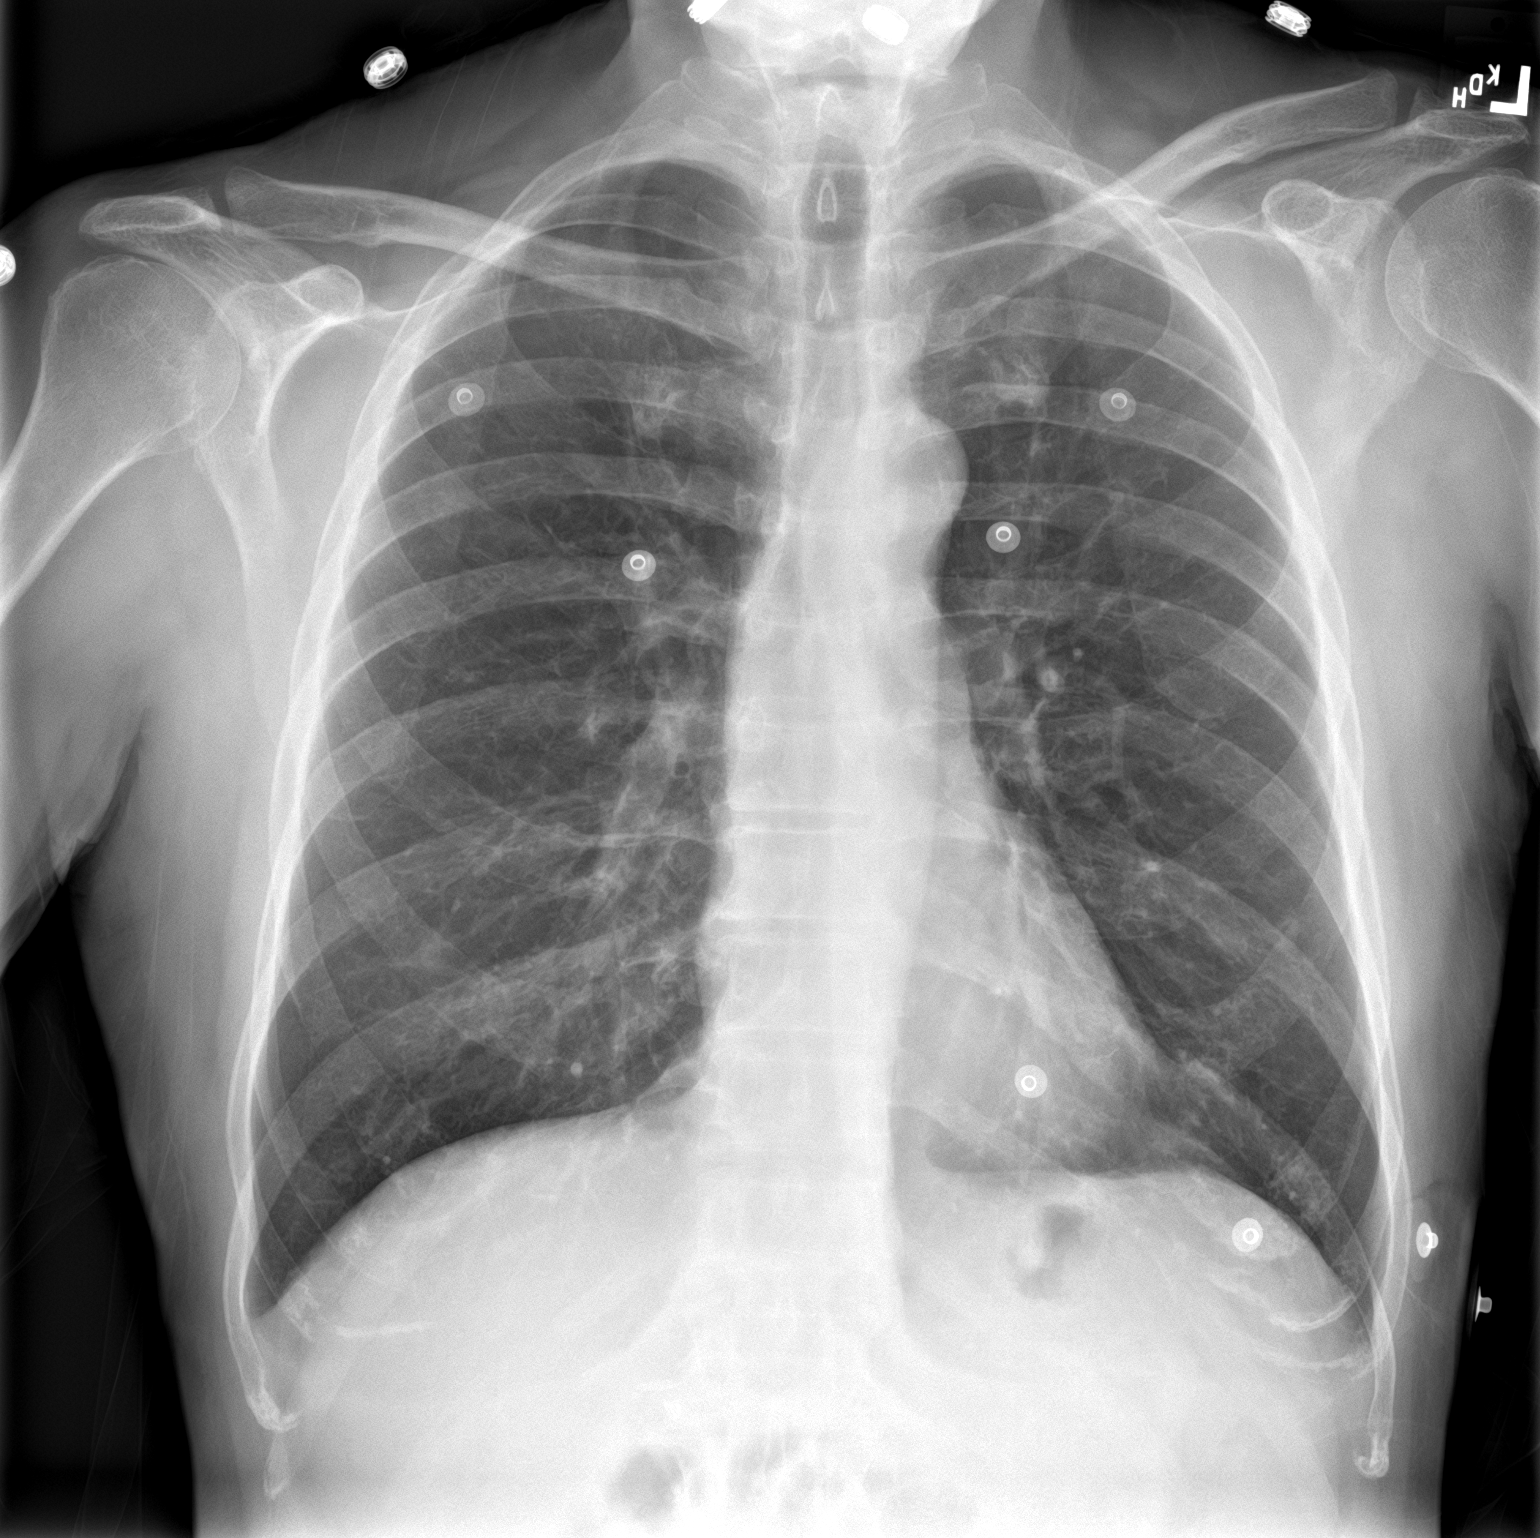

[chest lat]
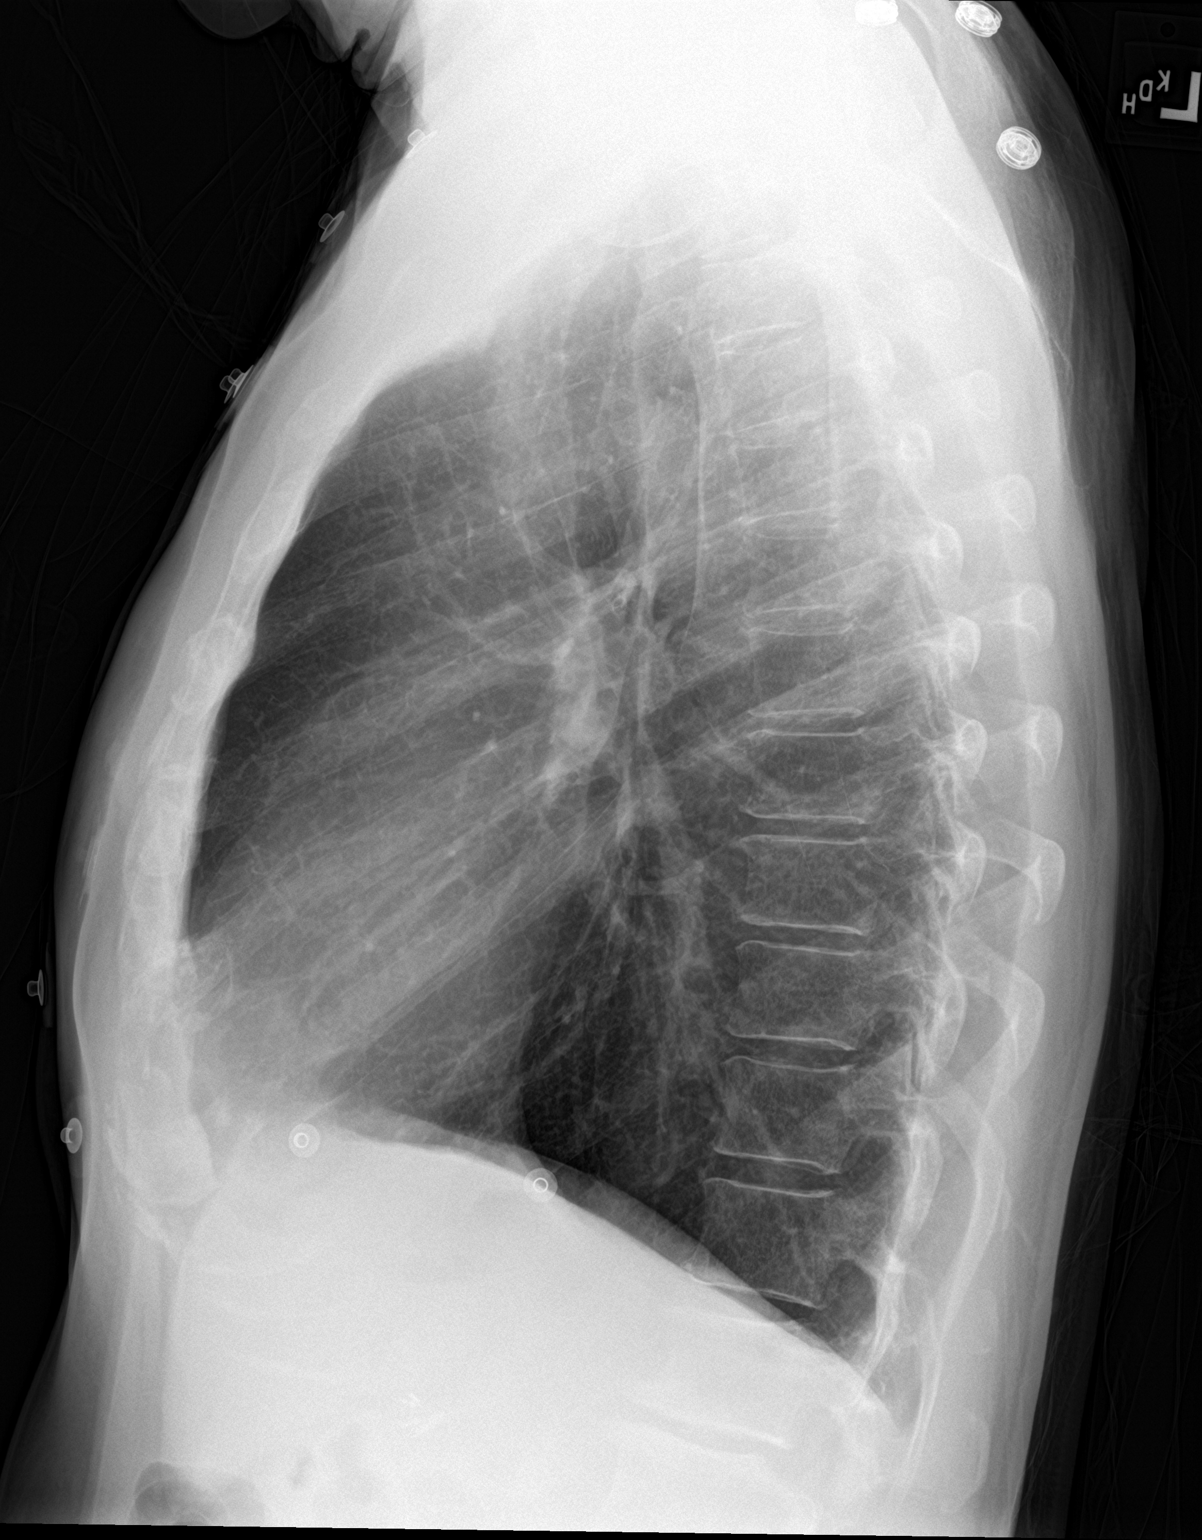

[2 of 2 positions shown; findings below may reference images not displayed]

FINDINGS: The heart size and mediastinal contours are within normal limits.
Both lungs are clear. The visualized skeletal structures are
unremarkable.
IMPRESSION: No active cardiopulmonary disease.

## 2023-11-19 ENCOUNTER — Emergency Department
Admission: EM | Admit: 2023-11-19 | Discharge: 2023-11-19 | Disposition: A | Discharge status: Home / Self Care | Attending: Emergency Medicine | Admitting: Emergency Medicine

## 2023-11-19 ENCOUNTER — Emergency Department

## 2023-11-19 ENCOUNTER — Other Ambulatory Visit: Payer: Self-pay

## 2023-11-19 ENCOUNTER — Ambulatory Visit: Admitting: Urology

## 2023-11-19 VITALS — BP 123/92 | HR 60

## 2023-11-19 DIAGNOSIS — I10 Essential (primary) hypertension: Secondary | ICD-10-CM | POA: Diagnosis not present

## 2023-11-19 DIAGNOSIS — N201 Calculus of ureter: Secondary | ICD-10-CM | POA: Diagnosis not present

## 2023-11-19 DIAGNOSIS — R1030 Lower abdominal pain, unspecified: Secondary | ICD-10-CM | POA: Insufficient documentation

## 2023-11-19 DIAGNOSIS — N200 Calculus of kidney: Secondary | ICD-10-CM

## 2023-11-19 DIAGNOSIS — R112 Nausea with vomiting, unspecified: Secondary | ICD-10-CM | POA: Diagnosis not present

## 2023-11-19 LAB — COMPREHENSIVE METABOLIC PANEL WITH GFR
ALT: 11 U/L (ref 0–44)
AST: 17 U/L (ref 15–41)
Albumin: 4.2 g/dL (ref 3.5–5.0)
Alkaline Phosphatase: 82 U/L (ref 38–126)
Anion gap: 11 (ref 5–15)
BUN: 10 mg/dL (ref 8–23)
CO2: 25 mmol/L (ref 22–32)
Calcium: 9.4 mg/dL (ref 8.9–10.3)
Chloride: 104 mmol/L (ref 98–111)
Creatinine, Ser: 1 mg/dL (ref 0.61–1.24)
GFR, Estimated: >60 mL/min (ref >60)
Glucose, Bld: 128 mg/dL — ABNORMAL HIGH (ref 70–99)
Potassium: 3.8 mmol/L (ref 3.5–5.1)
Sodium: 140 mmol/L (ref 135–145)
Total Bilirubin: 2.4 mg/dL — ABNORMAL HIGH (ref 0.0–1.2)
Total Protein: 7.4 g/dL (ref 6.5–8.1)

## 2023-11-19 LAB — URINALYSIS, ROUTINE W REFLEX MICROSCOPIC
Bacteria, UA: NONE SEEN
Bilirubin Urine: NEGATIVE
Glucose, UA: NEGATIVE mg/dL
Ketones, ur: 5 mg/dL — AB
Nitrite: NEGATIVE
Protein, ur: 100 mg/dL — AB
RBC / HPF: >50 RBC/hpf (ref 0–5)
Specific Gravity, Urine: 1.018 (ref 1.005–1.030)
pH: 6 (ref 5.0–8.0)

## 2023-11-19 LAB — LIPASE, BLOOD: Lipase: 50 U/L (ref 11–51)

## 2023-11-19 LAB — CBC
HCT: 52.2 % — ABNORMAL HIGH (ref 39.0–52.0)
Hemoglobin: 17.7 g/dL — ABNORMAL HIGH (ref 13.0–17.0)
MCH: 28 pg (ref 26.0–34.0)
MCHC: 33.9 g/dL (ref 30.0–36.0)
MCV: 82.6 fL (ref 80.0–100.0)
Platelets: 218 10*3/uL (ref 150–400)
RBC: 6.32 MIL/uL — ABNORMAL HIGH (ref 4.22–5.81)
RDW: 13.1 % (ref 11.5–15.5)
WBC: 8.9 10*3/uL (ref 4.0–10.5)
nRBC: 0 % (ref 0.0–0.2)

## 2023-11-19 MED ORDER — FENTANYL CITRATE PF 50 MCG/ML IJ SOSY
100.0000 ug | PREFILLED_SYRINGE | Freq: Once | INTRAMUSCULAR | Status: AC
  Administered 2023-11-19: 100 ug via INTRAVENOUS
  Filled 2023-11-19: qty 2

## 2023-11-19 MED ORDER — ONDANSETRON HCL 4 MG/2ML IJ SOLN
4.0000 mg | Freq: Once | INTRAMUSCULAR | Status: AC
  Administered 2023-11-19: 4 mg via INTRAVENOUS
  Filled 2023-11-19: qty 2

## 2023-11-19 MED ORDER — MORPHINE SULFATE (PF) 4 MG/ML IV SOLN
4.0000 mg | Freq: Once | INTRAVENOUS | Status: AC
  Administered 2023-11-19: 4 mg via INTRAVENOUS
  Filled 2023-11-19: qty 1

## 2023-11-19 MED ORDER — ONDANSETRON 4 MG PO TBDP: 4.0000 mg | ORAL_TABLET | Freq: Three times a day (TID) | ORAL | 0 refills | Status: AC | PRN

## 2023-11-19 MED ORDER — KETOROLAC TROMETHAMINE 30 MG/ML IJ SOLN
30.0000 mg | Freq: Once | INTRAMUSCULAR | Status: AC
  Administered 2023-11-19: 30 mg via INTRAVENOUS
  Filled 2023-11-19: qty 1

## 2023-11-19 MED ORDER — SODIUM CHLORIDE 0.9 % IV BOLUS
1000.0000 mL | Freq: Once | INTRAVENOUS | Status: AC
  Administered 2023-11-19: 1000 mL via INTRAVENOUS

## 2023-11-19 MED ORDER — OXYCODONE-ACETAMINOPHEN 5-325 MG PO TABS: 1.0000 | ORAL_TABLET | ORAL | 0 refills | Status: DC | PRN

## 2023-11-19 NOTE — Patient Instructions (Signed)
 ESWL for Kidney Stones  Extracorporeal shock wave lithotripsy (ESWL) is a treatment that can help break up kidney stones that are too large to pass on their own.  This is a nonsurgical procedure that breaks up a kidney stone with shock waves. These shock waves pass through your body and focus on the kidney stone. They cause the kidney stone to break into smaller pieces (fragments) while it is still in the urinary tract. The fragments of stone can pass more easily out of your body in the pee (urine). Tell a health care provider about: Any allergies you have. All medicines you are taking, including vitamins, herbs, eye drops, creams, and over-the-counter medicines. Any problems you or family members have had with anesthetic medicines. Any bleeding problems you have. Any surgeries you've had. Any medical conditions you have. Whether you're pregnant or may be pregnant. What are the risks? Your health care provider will talk with you about risks. These may include: Infection. Bleeding from the kidney. Bruising of the kidney or skin. Scarring of the kidney. This can lead to: Increased blood pressure. Poor kidney function. Return (recurrence) of kidney stones. Damage to other structures or organs. This may include the liver, colon, spleen, or pancreas. Blockage (obstruction) of the tube that carries pee from the kidney to the bladder (ureter). Failure of the kidney stone to break into fragments. What happens before the procedure? When to stop eating and drinking Follow instructions from your health care provider about what you may eat and drink. These may include: 8 hours before your procedure Stop eating most foods. Do not eat meat, fried foods, or fatty foods. Eat only light foods, such as toast or crackers. All liquids are okay except energy drinks and alcohol. 6 hours before your procedure Stop eating. Drink only clear liquids, such as water, clear fruit juice, black coffee, plain tea,  and sports drinks. Do not drink energy drinks or alcohol. 2 hours before your procedure Stop drinking all liquids. You may be allowed to take medicines with small sips of water. If you do not follow your health care provider's instructions, your procedure may be delayed or canceled. Medicines Ask your health care provider about: Changing or stopping your regular medicines. These include any diabetes medicines or blood thinners you take. Taking medicines such as aspirin and ibuprofen. These medicines can thin your blood. Do not take them unless your health care provider tells you to. Taking over-the-counter medicines, vitamins, herbs, and supplements. Tests You may have tests, such as: Blood tests. Pee (urine) tests. Imaging tests. This may include a CT scan. Surgery safety Ask your health care provider: How your surgery site will be marked. What steps will be taken to help prevent infection. These steps may include: Washing skin with a soap that kills germs. Receiving antibiotics. General instructions If you will be going home right after the procedure, plan to have a responsible adult: Take you home from the hospital or clinic. You will not be allowed to drive. Care for you for the time you are told. What happens during the procedure?  An IV will be inserted into one of your veins. You may be given: A sedative. This helps you relax. Anesthesia. This will: Numb certain areas of your body. Make you fall asleep for surgery. A water-filled cushion may be placed behind your kidney or on your abdomen. In some cases, you may be placed in a tub of lukewarm water. Your body will be positioned in a way that makes it  easier to target the kidney stone. An X-ray or ultrasound exam will be done to locate your stone. Shock waves will be aimed at the stone. If you are awake, you may feel a tapping sensation as the shock waves pass through your body. A small mesh tube (stent) may be placed in  your ureter. This will help keep pee flowing from the kidney if the fragments of the stone have been blocking the ureter. The stent will be removed at a later time by your health care provider. The procedure may vary among health care providers and hospitals. What happens after the procedure? Your blood pressure, heart rate, breathing rate, and blood oxygen level will be monitored until you leave the hospital or clinic. You may have an X-ray after the procedure to see how many of the kidney stones were broken up. This will also show how much of the stone has passed. If there are still large fragments after treatment, you may need to have a second procedure at a later time. This information is not intended to replace advice given to you by your health care provider. Make sure you discuss any questions you have with your health care provider. Document Revised: 12/29/2022 Document Reviewed: 10/19/2021 Elsevier Patient Education  2024 [employer].

## 2023-11-19 NOTE — Discharge Instructions (Addendum)
 Please take your pain and nausea medication as needed but only as prescribed.  Do not drink alcohol or drive while taking pain medication.  Please call the number provided for urology to arrange a follow-up appointment.  Return to the emergency department for any worsening pain, fever, or any other symptom personally concerning to yourself.

## 2023-11-19 NOTE — ED Triage Notes (Signed)
 Pt sts that he has been having N/V/D for the last three days. Pt endorsed lower right and left quad pain.

## 2023-11-19 NOTE — ED Provider Notes (Signed)
 [facility] Provider Note    Event Date/Time   First MD Initiated Contact with Patient 11/19/23 [phone]     (approximate)  History   Chief Complaint: Abdominal Pain  HPI  Jordan Hughes is a 68 y.o. male with a past medical history of hypertension, kidney stones, paroxysmal atrial fibrillation, presents to the emergency department for lower abdominal pain nausea and vomiting.  Patient states symptoms started 2 days ago but has progressively worsened.  Overnight patient with nausea and vomiting due to severe pain which she describes as both right and left flank.  Patient has a history of kidney stones to which this feels identical per patient.  No fever.  States nausea vomiting but no diarrhea.  No dysuria but states he has not been able to urinate much recently.  Physical Exam   Triage Vital Signs: ED Triage Vitals [11/19/23 0748]  Encounter Vitals Group     BP (!) 118/95     Systolic BP Percentile      Diastolic BP Percentile      Pulse Rate 97     Resp 17     Temp 98.1 F (36.7 C)     Temp Source Oral     SpO2 100 %     Weight 135 lb (61.2 kg)     Height 5\' 6"  (1.676 m)     Head Circumference      Peak Flow      Pain Score 10     Pain Loc      Pain Education      Exclude from Growth Chart     Most recent vital signs: Vitals:   11/19/23 0748  BP: (!) 118/95  Pulse: 97  Resp: 17  Temp: 98.1 F (36.7 C)  SpO2: 100%    General: Awake, mild distress, nauseated retching at times. CV:  Good peripheral perfusion.  Regular rate and rhythm  Resp:  Normal effort.  Equal breath sounds bilaterally.  Abd:  No distention.  Soft, nontender.  No rebound or guarding.  No CVA tenderness.   ED Results / Procedures / Treatments   RADIOLOGY  I reviewed interpreted CT images.  Patient appears to have a kidney stone approximately halfway down the right ureter with hydroureteronephrosis. Radiology is read the CT scan as 9 mm right proximal ureteral  stone with moderate hydronephrosis.   MEDICATIONS ORDERED IN ED: Medications - No data to display   IMPRESSION / MDM / ASSESSMENT AND PLAN / ED COURSE  I reviewed the triage vital signs and the nursing notes.  Patient's presentation is most consistent with acute presentation with potential threat to life or bodily function.  Patient presents to the emergency department for abdominal pain nausea and vomiting.  Patient is actively retching in the room.  Will treat pain nausea and IV hydrate.  Will check labs including a CBC and a chemistry.  Will obtain a CT renal scan to evaluate for ureteral lithiasis.  Will obtain a urine sample after IV hydration.  Patient agreeable to plan of care.  Patient's workup shows a reassuring CBC with a normal white blood cell count mild elevated hemoglobin likely consistent with dehydration.  Patient's chemistry overall reassuring including normal renal function.  LFTs and lipase are normal.  Patient CT scan shows a proximal 9 mm right ureteral stone likely the cause of the patient's symptoms.  Patient takes daily Flomax  already.  Will prescribe Percocet and Zofran  to be used as needed.  Will refer to urology as the patient may require a procedure.  Discussed return precautions.  We will obtain a urinalysis to ensure no infection.  No signs of infection on urinalysis.  Urine culture has been sent as precaution.  Will discharge with outpatient follow-up.  FINAL CLINICAL IMPRESSION(S) / ED DIAGNOSES   Abdominal pain Nausea vomiting   Note:  This document was prepared using Dragon voice recognition software and may include unintentional dictation errors.   Ruth Cove, MD 11/19/23 [phone]

## 2023-11-19 NOTE — Progress Notes (Signed)
   11/19/2023 2:48 PM   Jordan Hughes [birth year] [id number]  Reason for visit: Right flank pain  HPI: 68 year old male who I followed previously for recurrent nephrolithiasis requiring shockwave lithotripsy who presented to the ER early this morning with right-sided flank pain.  Evaluation with CT scan showed a 9 mm right proximal ureteral stone, no clinical or laboratory evidence of infection, and he was discharged with urology follow-up.  He was also admitted recently at [facility] for blood in the stool felt to be related to hemorrhoids, also was in A-fib with RVR at that time.  CT at that time showed a right UPJ stone but minimal hydronephrosis.  He has not taken any NSAIDs, aspirin, or other blood thinners.    I personally viewed and interpreted the CT scan from today showing a 1 cm right proximal ureteral stone with moderate hydronephrosis, easily seen on scout CT, 900HU, 9cm SSD.  We discussed various treatment options for urolithiasis including observation with or without medical expulsive therapy, shockwave lithotripsy (SWL), ureteroscopy and laser lithotripsy with stent placement, and percutaneous nephrolithotomy.We discussed that management is based on stone size, location, density, patient co-morbidities, and patient preference. Stones <37mm in size have a >80% spontaneous passage rate. Data surrounding the use of tamsulosin  for medical expulsive therapy is controversial, but meta analyses suggests it is most efficacious for distal stones between 5-69mm in size. Possible side effects include dizziness/lightheadedness, and retrograde ejaculation.SWL has a lower stone free rate in a single procedure, but also a lower complication rate compared to ureteroscopy and avoids a stent and associated stent related symptoms. Possible complications include renal hematoma, steinstrasse, and need for additional treatment.Ureteroscopy with laser lithotripsy and stent placement has a higher stone free rate  than SWL in a single procedure, however increased complication rate including possible infection, ureteral injury, bleeding, and stent related morbidity. Common stent related symptoms include dysuria, urgency/frequency, and flank pain.  After an extensive discussion of the risks and benefits of the above treatment options, the patient would like to proceed with right shockwave lithotripsy.  Schedule right shockwave lithotripsy this week  Lawerence Pressman, MD  [facility] [address] [place], [place] [address] [phone]

## 2023-11-19 NOTE — ED Notes (Signed)
 See triage note  Presents with lower abd pain  States pain started about 3 days ago  Has known kidney stones Pt is tearful  on arrival

## 2023-11-20 LAB — URINE CULTURE: Culture: <10000 — AB

## 2023-11-20 MED ORDER — DIPHENHYDRAMINE HCL 25 MG PO CAPS
25.0000 mg | ORAL_CAPSULE | ORAL | Status: AC
  Administered 2023-11-21: 25 mg via ORAL

## 2023-11-20 MED ORDER — DIAZEPAM 5 MG PO TABS
10.0000 mg | ORAL_TABLET | ORAL | Status: AC
  Administered 2023-11-21: 10 mg via ORAL

## 2023-11-21 ENCOUNTER — Emergency Department
Admission: EM | Admit: 2023-11-21 | Discharge: 2023-11-21 | Disposition: A | Discharge status: Home / Self Care | Source: Home / Self Care | Attending: Emergency Medicine | Admitting: Emergency Medicine

## 2023-11-21 ENCOUNTER — Other Ambulatory Visit: Payer: Self-pay

## 2023-11-21 ENCOUNTER — Ambulatory Visit

## 2023-11-21 ENCOUNTER — Encounter: Payer: Self-pay | Admitting: Urology

## 2023-11-21 ENCOUNTER — Encounter: Admission: RE | Payer: Self-pay | Source: Home / Self Care | Attending: Urology

## 2023-11-21 ENCOUNTER — Encounter: Payer: Self-pay | Admitting: Emergency Medicine

## 2023-11-21 ENCOUNTER — Ambulatory Visit: Admission: RE | Admit: 2023-11-21 | Discharge: 2023-11-21 | Attending: Urology | Admitting: Urology

## 2023-11-21 DIAGNOSIS — Z539 Procedure and treatment not carried out, unspecified reason: Secondary | ICD-10-CM | POA: Diagnosis not present

## 2023-11-21 DIAGNOSIS — I4891 Unspecified atrial fibrillation: Secondary | ICD-10-CM | POA: Insufficient documentation

## 2023-11-21 DIAGNOSIS — N201 Calculus of ureter: Secondary | ICD-10-CM | POA: Diagnosis present

## 2023-11-21 HISTORY — PX: EXTRACORPOREAL SHOCK WAVE LITHOTRIPSY: SHX1557

## 2023-11-21 LAB — COMPREHENSIVE METABOLIC PANEL WITH GFR
ALT: 16 U/L (ref 0–44)
AST: 19 U/L (ref 15–41)
Albumin: 3.6 g/dL (ref 3.5–5.0)
Alkaline Phosphatase: 69 U/L (ref 38–126)
Anion gap: 10 (ref 5–15)
BUN: 11 mg/dL (ref 8–23)
CO2: 23 mmol/L (ref 22–32)
Calcium: 8.4 mg/dL — ABNORMAL LOW (ref 8.9–10.3)
Chloride: 108 mmol/L (ref 98–111)
Creatinine, Ser: 0.87 mg/dL (ref 0.61–1.24)
GFR, Estimated: >60 mL/min (ref >60)
Glucose, Bld: 96 mg/dL (ref 70–99)
Potassium: 3.5 mmol/L (ref 3.5–5.1)
Sodium: 141 mmol/L (ref 135–145)
Total Bilirubin: 2.1 mg/dL — ABNORMAL HIGH (ref 0.0–1.2)
Total Protein: 6.2 g/dL — ABNORMAL LOW (ref 6.5–8.1)

## 2023-11-21 LAB — CBC
HCT: 45.5 % (ref 39.0–52.0)
Hemoglobin: 15.7 g/dL (ref 13.0–17.0)
MCH: 28.2 pg (ref 26.0–34.0)
MCHC: 34.5 g/dL (ref 30.0–36.0)
MCV: 81.8 fL (ref 80.0–100.0)
Platelets: 183 10*3/uL (ref 150–400)
RBC: 5.56 MIL/uL (ref 4.22–5.81)
RDW: 12.9 % (ref 11.5–15.5)
WBC: 10.3 10*3/uL (ref 4.0–10.5)
nRBC: 0 % (ref 0.0–0.2)

## 2023-11-21 LAB — TROPONIN I (HIGH SENSITIVITY): Troponin I (High Sensitivity): 3 ng/L (ref <18)

## 2023-11-21 SURGERY — LITHOTRIPSY, ESWL
Anesthesia: Monitor Anesthesia Care | Laterality: Right

## 2023-11-21 MED ORDER — METOPROLOL TARTRATE 25 MG PO TABS
25.0000 mg | ORAL_TABLET | Freq: Once | ORAL | Status: AC
  Administered 2023-11-21: 25 mg via ORAL
  Filled 2023-11-21: qty 1

## 2023-11-21 MED ORDER — CIPROFLOXACIN HCL 500 MG PO TABS
500.0000 mg | ORAL_TABLET | Freq: Once | ORAL | Status: AC
  Administered 2023-11-21: 500 mg via ORAL
  Filled 2023-11-21: qty 1

## 2023-11-21 MED ORDER — DOCUSATE SODIUM 100 MG PO CAPS: 100.0000 mg | ORAL_CAPSULE | Freq: Every day | ORAL | 2 refills | Status: DC | PRN

## 2023-11-21 MED ORDER — DIAZEPAM 5 MG PO TABS
ORAL_TABLET | ORAL | Status: AC
  Filled 2023-11-21: qty 2

## 2023-11-21 MED ORDER — ONDANSETRON HCL 4 MG/5ML PO SOLN
4.0000 mg | Freq: Once | ORAL | Status: DC
  Filled 2023-11-21: qty 5

## 2023-11-21 MED ORDER — POLYETHYLENE GLYCOL 3350 17 G PO PACK: 17.0000 g | PACK | Freq: Two times a day (BID) | ORAL | 0 refills | Status: DC

## 2023-11-21 MED ORDER — OXYCODONE-ACETAMINOPHEN 5-325 MG PO TABS
1.0000 | ORAL_TABLET | ORAL | 0 refills | Status: AC | PRN
Start: 2023-11-21 — End: ?

## 2023-11-21 MED ORDER — ONDANSETRON HCL 4 MG/2ML IJ SOLN
INTRAMUSCULAR | Status: AC
  Filled 2023-11-21: qty 2

## 2023-11-21 MED ORDER — DILTIAZEM HCL 25 MG/5ML IV SOLN
15.0000 mg | Freq: Once | INTRAVENOUS | Status: AC
  Administered 2023-11-21: 15 mg via INTRAVENOUS
  Filled 2023-11-21: qty 5

## 2023-11-21 MED ORDER — DIPHENHYDRAMINE HCL 25 MG PO CAPS
ORAL_CAPSULE | ORAL | Status: AC
  Filled 2023-11-21: qty 1

## 2023-11-21 MED ORDER — METOPROLOL TARTRATE 25 MG PO TABS: 25.0000 mg | ORAL_TABLET | Freq: Two times a day (BID) | ORAL | 11 refills | Status: DC

## 2023-11-21 MED ORDER — ONDANSETRON HCL 4 MG/2ML IJ SOLN
4.0000 mg | Freq: Once | INTRAMUSCULAR | Status: AC
  Administered 2023-11-21: 4 mg via INTRAVENOUS

## 2023-11-21 NOTE — ED Triage Notes (Signed)
 First nurse note: Pt brought over by transport from lithotripsy truck. Pt scheduled for procedure but sent over for increased HR and BP. HR on EKG strip 112. IV in place

## 2023-11-21 NOTE — Progress Notes (Signed)
 Jordan Hughes scheduled for SWL of a right proximal ureteral calculus this morning.  When placed on the monitor on lithoheart rate was 130 and a rhythm strip consistent with A-fib.  BP was 173/100.  He was unable to be treated and was transferred to the ED for further evaluation.  He was hospitalized at [facility] late last month with atrial fibrillation with RVR which spontaneously converted during that hospitalization.

## 2023-11-21 NOTE — ED Provider Notes (Signed)
 [facility] Provider Note    Event Date/Time   First MD Initiated Contact with Patient 11/21/23 [phone]     (approximate)   History   Tachycardia   HPI  Jordan Hughes is a 68 y.o. male who was scheduled to receive lithotripsy today but was sent to the emergency department because of elevated heart rate.  Patient reports he was diagnosed a few weeks ago with atrial fibrillation but has not been put on any medication for this.  Review of records demonstrates the patient was admitted to [facility] on April 29, had paroxysmal atrial fibrillation which resolved after treatment of his abdominal pain     Physical Exam   Triage Vital Signs: ED Triage Vitals  Encounter Vitals Group     BP 11/21/23 0944 (!) 120/100     Systolic BP Percentile --      Diastolic BP Percentile --      Pulse Rate 11/21/23 0944 73     Resp 11/21/23 0944 18     Temp 11/21/23 0944 99 F (37.2 C)     Temp Source 11/21/23 0944 Oral     SpO2 11/21/23 0944 98 %     Weight 11/21/23 0941 61.2 kg (135 lb)     Height 11/21/23 0941 1.676 m (5\' 6" )     Head Circumference --      Peak Flow --      Pain Score 11/21/23 0942 5     Pain Loc --      Pain Education --      Exclude from Growth Chart --     Most recent vital signs: Vitals:   11/21/23 1030 11/21/23 1130  BP: 118/87 118/89  Pulse: 72 79  Resp: 18 17  Temp:    SpO2: 94% 96%     General: Awake, no distress.  CV:  Good peripheral perfusion.  Irregularly irregular rhythm with tachycardia Resp:  Normal effort.  Abd:  No distention.  Other:     ED Results / Procedures / Treatments   Labs (all labs ordered are listed, but only abnormal results are displayed) Labs Reviewed  COMPREHENSIVE METABOLIC PANEL WITH GFR - Abnormal; Notable for the following components:      Result Value   Calcium  8.4 (*)    Total Protein 6.2 (*)    Total Bilirubin 2.1 (*)    All other components within normal limits  CBC  TROPONIN I  (HIGH SENSITIVITY)     EKG  ED ECG REPORT I, Bryson Carbine, the attending physician, personally viewed and interpreted this ECG.  Date: 11/21/2023  Rhythm: Atrial fibrillation with RVR QRS Axis: normal Intervals: Abnormal ST/T Wave abnormalities: normal Narrative Interpretation: no evidence of acute ischemia    RADIOLOGY     PROCEDURES:  Critical Care performed: yes  CRITICAL CARE Performed by: Bryson Carbine   Total critical care time: 30 minutes  Critical care time was exclusive of separately billable procedures and treating other patients.  Critical care was necessary to treat or prevent imminent or life-threatening deterioration.  Critical care was time spent personally by me on the following activities: development of treatment plan with patient and/or surrogate as well as nursing, discussions with consultants, evaluation of patient's response to treatment, examination of patient, obtaining history from patient or surrogate, ordering and performing treatments and interventions, ordering and review of laboratory studies, ordering and review of radiographic studies, pulse oximetry and re-evaluation of patient's condition.   Procedures  MEDICATIONS ORDERED IN ED: Medications  diltiazem (CARDIZEM) injection 15 mg (15 mg Intravenous Given 11/21/23 1005)  metoprolol  tartrate (LOPRESSOR ) tablet 25 mg (25 mg Oral Given 11/21/23 1055)     IMPRESSION / MDM / ASSESSMENT AND PLAN / ED COURSE  I reviewed the triage vital signs and the nursing notes. Patient's presentation is most consistent with acute presentation with potential threat to life or bodily function.  Patient presents with tachycardia as described, consistent with atrial fibrillation with RVR on EKG.  Review of [facility] records demonstrates that he had paroxysmal atrial fibrillation which resolved during the hospitalization so he was not started on any rate control, does have a history of possible internal  bleeding so has not been put on blood thinners either  He has no chest pain currently but does have a heart rate ranging from 1 20-1 40s.  Will give a bolus of IV Cardizem, he is quite anxious to receive his lithotripsy because of his continued pain from his large kidney stone  Patient had excellent response to IV Cardizem, heart rate now between 70 and 80, still appears to be in atrial fibrillation.  Discussed the case with Dr. Nolan Battle of cardiology who recommends starting metoprolol , outpatient follow-up with his office.  He agrees the patient is fine to have lithotripsy today if this can be organized  Discussed with Dr. Ace Holder to see if we can still have the lithotripsy happen today unfortunately the lithotripsy truck is gone and so there is no option for this.  Patient and family are upset by this, I have refilled his pain medication, recommended twice daily MiraLAX and stool softeners and close follow-up with urology  Metoprolol  prescription prescribed, cardiology follow-up order placed      FINAL CLINICAL IMPRESSION(S) / ED DIAGNOSES   Final diagnoses:  Atrial fibrillation with RVR (HCC)     Rx / DC Orders   ED Discharge Orders          Ordered    metoprolol  tartrate (LOPRESSOR ) 25 MG tablet  2 times daily        11/21/23 1059    Ambulatory referral to Cardiology       Comments: If you have not heard from the Cardiology office within the next 72 hours please call [phone].   11/21/23 1059    oxyCODONE -acetaminophen  (PERCOCET) 5-325 MG tablet  Every 4 hours PRN        11/21/23 1148    polyethylene glycol (MIRALAX) 17 g packet  2 times daily        11/21/23 1148    docusate sodium (COLACE) 100 MG capsule  Daily PRN        11/21/23 1148             Note:  This document was prepared using Dragon voice recognition software and may include unintentional dictation errors.   Bryson Carbine, MD 11/21/23 [phone]

## 2023-11-21 NOTE — Interval H&P Note (Signed)
 History and Physical Interval Note:  11/21/2023 9:45 AM  Jordan Hughes  has presented today for surgery, with the diagnosis of Right ureteral stone.  The various methods of treatment have been discussed with the patient and family. After consideration of risks, benefits and other options for treatment, the patient has consented to  Procedure(s): LITHOTRIPSY, ESWL (Right) as a surgical intervention.  The patient's history has been reviewed, patient examined, no change in status, stable for surgery.  I have reviewed the patient's chart and labs.  Questions were answered to the patient's satisfaction.    On monitor noted to be in atrial fibrillation with heart rate 130.  Rhythm strip consistent with A-fib.  Procedure was canceled and he was transported to the ED for further evaluation  Geraline Knapp

## 2023-11-21 NOTE — Progress Notes (Signed)
 Call received from Litho truck, patient appears to be in Afib. Increased HR and BP -Patient was transferred from [facility] to ED per Dr. Mindi Alto orders for pain management.

## 2023-11-21 NOTE — ED Triage Notes (Signed)
 Pt via POV from lithotripsy truck, pt was suppose to get lithotripsy today. Pt was sent over for increased HR and BP. Reports a HR of 112 and BP 173/100. Pt c/o NV and intermittent abd pain. Pt is A&Ox4 and NAD

## 2023-11-21 NOTE — H&P (View-Only) (Signed)
 Jordan Hughes scheduled for SWL of a right proximal ureteral calculus this morning.  When placed on the monitor on lithoheart rate was 130 and a rhythm strip consistent with A-fib.  BP was 173/100.  He was unable to be treated and was transferred to the ED for further evaluation.  He was hospitalized at [facility] late last month with atrial fibrillation with RVR which spontaneously converted during that hospitalization.

## 2023-11-22 ENCOUNTER — Encounter: Payer: Self-pay | Admitting: Urology

## 2023-11-26 ENCOUNTER — Ambulatory Visit: Attending: Cardiology | Admitting: Cardiology

## 2023-11-26 ENCOUNTER — Encounter: Payer: Self-pay | Admitting: Cardiology

## 2023-11-26 VITALS — BP 120/80 | HR 98 | Ht 66.0 in | Wt 138.2 lb

## 2023-11-26 DIAGNOSIS — I10 Essential (primary) hypertension: Secondary | ICD-10-CM

## 2023-11-26 DIAGNOSIS — I4891 Unspecified atrial fibrillation: Secondary | ICD-10-CM

## 2023-11-26 MED ORDER — APIXABAN 5 MG PO TABS: 5.0000 mg | ORAL_TABLET | Freq: Two times a day (BID) | ORAL | 3 refills | Status: DC

## 2023-11-26 NOTE — Patient Instructions (Signed)
 Medication Instructions:  Your physician recommends the following medication changes.  START TAKING: Eliquis twice a day  *If you need a refill on your cardiac medications before your next appointment, please call your pharmacy*  Lab Work: No labs ordered today  If you have labs (blood work) drawn today and your tests are completely normal, you will receive your results only by: MyChart Message (if you have MyChart) OR A paper copy in the mail If you have any lab test that is abnormal or we need to change your treatment, we will call you to review the results.  Testing/Procedures: Your physician has requested that you have an echocardiogram. Echocardiography is a painless test that uses sound waves to create images of your heart. It provides your doctor with information about the size and shape of your heart and how well your heart's chambers and valves are working.   You may receive an ultrasound enhancing agent through an IV if needed to better visualize your heart during the echo. This procedure takes approximately one hour.  There are no restrictions for this procedure.  This will take place at [address] [facility]) [address], [place] [address]  Please note: We ask at that you not bring children with you during ultrasound (echo/ vascular) testing. Due to room size and safety concerns, children are not allowed in the ultrasound rooms during exams. Our front office staff cannot provide observation of children in our lobby area while testing is being conducted. An adult accompanying a patient to their appointment will only be allowed in the ultrasound room at the discretion of the ultrasound technician under special circumstances. We apologize for any inconvenience.   Follow-Up: At [facility], you and your health needs are our priority.  As part of our continuing mission to provide you with exceptional heart care, our providers are all part of one team.  This  team includes your primary Cardiologist (physician) and Advanced Practice Providers or APPs (Physician Assistants and Nurse Practitioners) who all work together to provide you with the care you need, when you need it.  Your next appointment:   4 week(s)  Provider:   You may see ANY of the following Advanced Practice Providers on your designated Care Team:   Laneta Pintos, NP Gildardo Labrador, PA-C Varney Gentleman, PA-C Cadence [place]bron, PA-C Ronald Cockayne, NP Morey Ar, NP    We recommend signing up for the patient portal called "MyChart".  Sign up information is provided on this After Visit Summary.  MyChart is used to connect with patients for Virtual Visits (Telemedicine).  Patients are able to view lab/test results, encounter notes, upcoming appointments, etc.  Non-urgent messages can be sent to your provider as well.   To learn more about what you can do with MyChart, go to [email or web address].

## 2023-11-26 NOTE — Progress Notes (Signed)
 Cardiology Office Note:    Date:  11/26/2023   ID:  Jordan Hughes, DOB [birth year], MRN [id number]  PCP:  Claudine Cullens, MD   [facility] Providers Cardiologist:  None     Referring MD: Claudine Cullens, MD   Chief Complaint  Patient presents with   New Patient (Initial Visit)    Patient was at [facility] with A-Fib with RVR.  Patient c/o no energy, occasional irregular heart beats and tiredness.     History of Present Illness:    Jordan Hughes is a 68 y.o. male with a hx of hypertension, persistent atrial fibrillation who presents due to A-fib.  Presented to the ED 11/21/2023 with symptoms of elevated heart rates.  Was scheduled for lithotripsy on that date, procedure was canceled due to elevated heart rates and A-fib.  While in the ED noted to be in A-fib RVR with heart rates in 120s to 140 bpm.  Given IV Cardizem with good response and heart rates, improving to the 70s.  Started on Lopressor  25 mg twice daily prior to discharge.  Was seen at [facility] 3 weeks earlier with palpitations, advised to follow-up with primary care physician.  Has a history of hypertension, BP normalized after weight loss.  Has occasional dizziness.  Denies hematuria, had 1 episode of blood in his stool 3 weeks ago.  Has not had any episodes since.  H&H has been normal.  Past Medical History:  Diagnosis Date   Arthritis    joint pain   Gout    HOH (hard of hearing)    Hypertension    controlled on meds   Kidney stones    Tachycardia     Past Surgical History:  Procedure Laterality Date   abscess tonsil surgery     did not remove   APPENDECTOMY     CHOLECYSTECTOMY     COLONOSCOPY WITH PROPOFOL  N/A 05/13/2017   Procedure: COLONOSCOPY WITH PROPOFOL ;  Surgeon: Marnee Sink, MD;  Location: [facility];  Service: Endoscopy;  Laterality: N/A;   COLONOSCOPY WITH PROPOFOL   10/16/2021   Procedure: COLONOSCOPY WITH PROPOFOL   procedure aborted;  Surgeon: Marnee Sink, MD;  Location: [facility];  Service: Endoscopy;;   COLONOSCOPY WITH PROPOFOL  N/A 09/24/2022   Procedure: COLONOSCOPY WITH PROPOFOL ;  Surgeon: Marnee Sink, MD;  Location: [facility];  Service: Endoscopy;  Laterality: N/A;   EXTRACORPOREAL SHOCK WAVE LITHOTRIPSY Left 06/01/2021   Procedure: EXTRACORPOREAL SHOCK WAVE LITHOTRIPSY (ESWL);  Surgeon: Dustin Gimenez, MD;  Location: [facility] OR[facility];  Service: Urology;  Laterality: Left;   EXTRACORPOREAL SHOCK WAVE LITHOTRIPSY Right 11/21/2023   Procedure: LITHOTRIPSY, ESWL;  Surgeon: Geraline Knapp, MD;  Location: [facility] OR[facility];  Service: Urology;  Laterality: Right;   LITHOTRIPSY     patient states 5 times   POLYPECTOMY N/A 05/13/2017   Procedure: POLYPECTOMY;  Surgeon: Marnee Sink, MD;  Location: [facility];  Service: Endoscopy;  Laterality: N/A;   POLYPECTOMY  09/24/2022   Procedure: POLYPECTOMY;  Surgeon: Marnee Sink, MD;  Location: [facility];  Service: Endoscopy;;    Current Medications: Current Meds  Medication Sig   apixaban (ELIQUIS) 5 MG TABS tablet Take 1 tablet (5 mg total) by mouth 2 (two) times daily.   docusate sodium (COLACE) 100 MG capsule Take 1 capsule (100 mg total) by mouth daily as needed.   metoprolol  tartrate (LOPRESSOR ) 25 MG tablet Take 1 tablet (25 mg total) by mouth 2 (two) times daily.   ondansetron  (  ZOFRAN -ODT) 4 MG disintegrating tablet Take 1 tablet (4 mg total) by mouth every 8 (eight) hours as needed for nausea or vomiting.   oxyCODONE -acetaminophen  (PERCOCET) 5-325 MG tablet Take 1 tablet by mouth every 4 (four) hours as needed for severe pain (pain score 7-10).   polyethylene glycol (MIRALAX) 17 g packet Take 17 g by mouth 2 (two) times daily.   tamsulosin  (FLOMAX ) 0.4 MG CAPS capsule Take 1 capsule (0.4 mg total) by mouth daily. am     Allergies:   Cephalexin    Social History   Socioeconomic History   Marital status: Married    Spouse name: Not on file   Number of children: Not on file   Years of  education: Not on file   Highest education level: Not on file  Occupational History   Not on file  Tobacco Use   Smoking status: Never   Smokeless tobacco: Never  Vaping Use   Vaping status: Never Used  Substance and Sexual Activity   Alcohol use: Not Currently    Comment: no alcohol since 2019   Drug use: No   Sexual activity: Yes    Birth control/protection: None  Other Topics Concern   Not on file  Social History Narrative   Not on file   Social Drivers of Health   Financial Resource Strain: Not on file  Food Insecurity: Not on file  Transportation Needs: Not on file  Physical Activity: Not on file  Stress: Not on file  Social Connections: Not on file     Family History: The patient's family history includes Arrhythmia in his mother; Bladder Cancer in his paternal uncle; Clotting disorder in his brother; Heart attack in his paternal grandfather; Supraventricular tachycardia in his mother. There is no history of Prostate cancer or Kidney cancer.  ROS:   Please see the history of present illness.     All other systems reviewed and are negative.  EKGs/Labs/Other Studies Reviewed:    The following studies were reviewed today:  EKG Interpretation Date/Time:  Tuesday Nov 26 2023 11:09:39 EDT Ventricular Rate:  98 PR Interval:    QRS Duration:  70 QT Interval:  326 QTC Calculation: 416 R Axis:   -59  Text Interpretation: Atrial fibrillation Left axis deviation Confirmed by Constancia Delton ([id number]) on 11/26/2023 11:26:26 AM    Recent Labs: 11/21/2023: ALT 16; BUN 11; Creatinine, Ser 0.87; Hemoglobin 15.7; Platelets 183; Potassium 3.5; Sodium 141  Recent Lipid Panel No results found for: "CHOL", "TRIG", "HDL", "CHOLHDL", "VLDL", "LDLCALC", "LDLDIRECT"   Risk Assessment/Calculations:             Physical Exam:    VS:  BP 120/80 (BP Location: Right Arm, Patient Position: Sitting, Cuff Size: Normal)   Pulse 98   Ht 5\' 6"  (1.676 m)   Wt 138 lb 4 oz (62.7 kg)    SpO2 98%   BMI 22.31 kg/m     Wt Readings from Last 3 Encounters:  11/26/23 138 lb 4 oz (62.7 kg)  11/21/23 135 lb (61.2 kg)  11/21/23 135 lb (61.2 kg)     GEN:  Well nourished, well developed in no acute distress HEENT: Normal NECK: No JVD; No carotid bruits LYMPHATICS: No lymphadenopathy CARDIAC: Irregular irregular RESPIRATORY:  Clear to auscultation without rales, wheezing or rhonchi  ABDOMEN: Soft, non-tender, non-distended MUSCULOSKELETAL:  No edema; No deformity  SKIN: Warm and dry NEUROLOGIC:  Alert and oriented x 3 PSYCHIATRIC:  Normal affect   ASSESSMENT:    1.  Atrial fibrillation, unspecified type (HCC)   2. Primary hypertension    PLAN:    In order of problems listed above:  Persistent atrial fibrillation, heart rate better controlled.  Continue Lopressor  25 mg twice daily.  Start Eliquis 5 mg twice daily.  Obtain echocardiogram.  Plan DC cardioversion if patient still in A-fib at follow-up visit after at least 3 weeks of anticoagulation. Hypertension, BP controlled.  Lopressor  as above.  Follow-up in 4 weeks to schedule DC cardioversion if still in A-fib.     Medication Adjustments/Labs and Tests Ordered: Current medicines are reviewed at length with the patient today.  Concerns regarding medicines are outlined above.  Orders Placed This Encounter  Procedures   EKG 12-Lead   ECHOCARDIOGRAM COMPLETE   Meds ordered this encounter  Medications   apixaban (ELIQUIS) 5 MG TABS tablet    Sig: Take 1 tablet (5 mg total) by mouth 2 (two) times daily.    Dispense:  60 tablet    Refill:  3    Patient Instructions  Medication Instructions:  Your physician recommends the following medication changes.  START TAKING: Eliquis twice a day  *If you need a refill on your cardiac medications before your next appointment, please call your pharmacy*  Lab Work: No labs ordered today  If you have labs (blood work) drawn today and your tests are completely normal,  you will receive your results only by: MyChart Message (if you have MyChart) OR A paper copy in the mail If you have any lab test that is abnormal or we need to change your treatment, we will call you to review the results.  Testing/Procedures: Your physician has requested that you have an echocardiogram. Echocardiography is a painless test that uses sound waves to create images of your heart. It provides your doctor with information about the size and shape of your heart and how well your heart's chambers and valves are working.   You may receive an ultrasound enhancing agent through an IV if needed to better visualize your heart during the echo. This procedure takes approximately one hour.  There are no restrictions for this procedure.  This will take place at [address] [facility]) [address], [place] [address]  Please note: We ask at that you not bring children with you during ultrasound (echo/ vascular) testing. Due to room size and safety concerns, children are not allowed in the ultrasound rooms during exams. Our front office staff cannot provide observation of children in our lobby area while testing is being conducted. An adult accompanying a patient to their appointment will only be allowed in the ultrasound room at the discretion of the ultrasound technician under special circumstances. We apologize for any inconvenience.   Follow-Up: At [facility], you and your health needs are our priority.  As part of our continuing mission to provide you with exceptional heart care, our providers are all part of one team.  This team includes your primary Cardiologist (physician) and Advanced Practice Providers or APPs (Physician Assistants and Nurse Practitioners) who all work together to provide you with the care you need, when you need it.  Your next appointment:   4 week(s)  Provider:   You may see ANY of the following Advanced Practice Providers on your designated  Care Team:   Laneta Pintos, NP Gildardo Labrador, PA-C Varney Gentleman, PA-C Cadence [place], PA-C Ronald Cockayne, NP Morey Ar, NP    We recommend signing up for the patient portal called "MyChart".  Sign up information is provided on this After Visit Summary.  MyChart is used to connect with patients for Virtual Visits (Telemedicine).  Patients are able to view lab/test results, encounter notes, upcoming appointments, etc.  Non-urgent messages can be sent to your provider as well.   To learn more about what you can do with MyChart, go to [email or web address].         Signed, Constancia Delton, MD  11/26/2023 12:17 PM    [facility]

## 2023-11-27 ENCOUNTER — Telehealth: Payer: Self-pay

## 2023-11-27 NOTE — Telephone Encounter (Signed)
 Called patient to reschedule surgery/litho. Spoke with Dr. Estanislao Heimlich since patient was seen by Cardiology yesterday and started on Eliquis for A-Fib. Dr. Estanislao Heimlich advised we could reach out to Cardiology and start a clearance and add patient on for a Ureteroscopy for Stone Management. Spoke with patient who is feeling ill and wants to get Eliquis started and things under control before pursuing surgery. Advised patient to call back when ready to schedule and will go from there.

## 2023-12-10 ENCOUNTER — Ambulatory Visit: Attending: Cardiology

## 2023-12-10 DIAGNOSIS — I4891 Unspecified atrial fibrillation: Secondary | ICD-10-CM | POA: Diagnosis not present

## 2023-12-11 LAB — ECHOCARDIOGRAM COMPLETE
AR max vel: 3.1 cm2
AV Area VTI: 2.98 cm2
AV Area mean vel: 2.99 cm2
AV Mean grad: 3 mmHg
AV Peak grad: 5.1 mmHg
Ao pk vel: 1.13 m/s
Area-P 1/2: 3.99 cm2
Calc EF: 65.8 %
P 1/2 time: 662 ms
S' Lateral: 2.6 cm
Single Plane A2C EF: 64.3 %
Single Plane A4C EF: 65.8 %

## 2023-12-13 ENCOUNTER — Ambulatory Visit: Payer: Self-pay | Admitting: Cardiology

## 2023-12-24 ENCOUNTER — Encounter: Payer: Self-pay | Admitting: Urology

## 2023-12-30 ENCOUNTER — Ambulatory Visit: Attending: Cardiology | Admitting: Cardiology

## 2023-12-30 ENCOUNTER — Encounter: Payer: Self-pay | Admitting: Cardiology

## 2023-12-30 VITALS — BP 122/68 | HR 50 | Ht 66.0 in | Wt 141.2 lb

## 2023-12-30 DIAGNOSIS — I480 Paroxysmal atrial fibrillation: Secondary | ICD-10-CM

## 2023-12-30 DIAGNOSIS — I10 Essential (primary) hypertension: Secondary | ICD-10-CM

## 2023-12-30 MED ORDER — METOPROLOL TARTRATE 25 MG PO TABS: 12.5000 mg | ORAL_TABLET | Freq: Two times a day (BID) | ORAL | 11 refills | Status: AC

## 2023-12-30 NOTE — Patient Instructions (Signed)
 Medication Instructions:  - reduce metoprolol  to 12.5 mg twice a day   *If you need a refill on your cardiac medications before your next appointment, please call your pharmacy*  Lab Work: No labs ordered today  If you have labs (blood work) drawn today and your tests are completely normal, you will receive your results only by: MyChart Message (if you have MyChart) OR A paper copy in the mail If you have any lab test that is abnormal or we need to change your treatment, we will call you to review the results.  Testing/Procedures: No test ordered today   Follow-Up: At [facility], you and your health needs are our priority.  As part of our continuing mission to provide you with exceptional heart care, our providers are all part of one team.  This team includes your primary Cardiologist (physician) and Advanced Practice Providers or APPs (Physician Assistants and Nurse Practitioners) who all work together to provide you with the care you need, when you need it.  Your next appointment:   6 month(s)  Provider:   You may see one of the following Advanced Practice Providers on your designated Care Team:   Lonni Meager, NP Lesley Maffucci, PA-C Bernardino Bring, PA-C Cadence Rodriguez Camp, PA-C Tylene Lunch, NP Barnie Hila, NP    We recommend signing up for the patient portal called MyChart.  Sign up information is provided on this After Visit Summary.  MyChart is used to connect with patients for Virtual Visits (Telemedicine).  Patients are able to view lab/test results, encounter notes, upcoming appointments, etc.  Non-urgent messages can be sent to your provider as well.   To learn more about what you can do with MyChart, go to [email or web address].

## 2023-12-30 NOTE — Progress Notes (Signed)
 Cardiology Office Note:    Date:  12/30/2023   ID:  Jordan Hughes, DOB [birth year], MRN [id number]  PCP:  Derick Leita POUR, MD   [facility] Providers Cardiologist:  None     Referring MD: Derick Leita POUR, MD   Chief Complaint  Patient presents with   Follow-up    F/u echo 4 wk  pt has been doing well with no complaints of chest pain, chest pressure or SOB, medciation reviewed verbally with patient    History of Present Illness:    Jordan Hughes is a 68 y.o. male with a hx of hypertension, paroxysmal atrial fibrillation who presents for follow-up.  Previously seen due to persistent A-fib, started on Lopressor , Eliquis .  Denies palpitations, denies dizziness, presyncope or syncope.  Has no bleeding issues with Eliquis .   Past Medical History:  Diagnosis Date   Arthritis    joint pain   Gout    HOH (hard of hearing)    Hypertension    controlled on meds   Kidney stones    Tachycardia     Past Surgical History:  Procedure Laterality Date   abscess tonsil surgery     did not remove   APPENDECTOMY     CHOLECYSTECTOMY     COLONOSCOPY WITH PROPOFOL  N/A 05/13/2017   Procedure: COLONOSCOPY WITH PROPOFOL ;  Surgeon: Jinny Carmine, MD;  Location: [facility];  Service: Endoscopy;  Laterality: N/A;   COLONOSCOPY WITH PROPOFOL   10/16/2021   Procedure: COLONOSCOPY WITH PROPOFOL   procedure aborted;  Surgeon: Jinny Carmine, MD;  Location: [facility];  Service: Endoscopy;;   COLONOSCOPY WITH PROPOFOL  N/A 09/24/2022   Procedure: COLONOSCOPY WITH PROPOFOL ;  Surgeon: Jinny Carmine, MD;  Location: [facility];  Service: Endoscopy;  Laterality: N/A;   EXTRACORPOREAL SHOCK WAVE LITHOTRIPSY Left 06/01/2021   Procedure: EXTRACORPOREAL SHOCK WAVE LITHOTRIPSY (ESWL);  Surgeon: Penne Knee, MD;  Location: [facility];  Service: Urology;  Laterality: Left;   EXTRACORPOREAL SHOCK WAVE LITHOTRIPSY Right 11/21/2023   Procedure: LITHOTRIPSY, ESWL;  Surgeon:  Twylla Glendia BROCKS, MD;  Location: [facility];  Service: Urology;  Laterality: Right;   LITHOTRIPSY     patient states 5 times   POLYPECTOMY N/A 05/13/2017   Procedure: POLYPECTOMY;  Surgeon: Jinny Carmine, MD;  Location: [facility];  Service: Endoscopy;  Laterality: N/A;   POLYPECTOMY  09/24/2022   Procedure: POLYPECTOMY;  Surgeon: Jinny Carmine, MD;  Location: [facility];  Service: Endoscopy;;    Current Medications: Current Meds  Medication Sig   apixaban  (ELIQUIS ) 5 MG TABS tablet Take 1 tablet (5 mg total) by mouth 2 (two) times daily.   docusate sodium  (COLACE) 100 MG capsule Take 1 capsule (100 mg total) by mouth daily as needed.   ondansetron  (ZOFRAN -ODT) 4 MG disintegrating tablet Take 1 tablet (4 mg total) by mouth every 8 (eight) hours as needed for nausea or vomiting.   oxyCODONE -acetaminophen  (PERCOCET) 5-325 MG tablet Take 1 tablet by mouth every 4 (four) hours as needed for severe pain (pain score 7-10).   polyethylene glycol (MIRALAX ) 17 g packet Take 17 g by mouth 2 (two) times daily.   tamsulosin  (FLOMAX ) 0.4 MG CAPS capsule Take 1 capsule (0.4 mg total) by mouth daily. am   [DISCONTINUED] metoprolol  tartrate (LOPRESSOR ) 25 MG tablet Take 1 tablet (25 mg total) by mouth 2 (two) times daily.     Allergies:   Cephalexin    Social History   Socioeconomic History   Marital status: Married  Spouse name: Not on file   Number of children: Not on file   Years of education: Not on file   Highest education level: Not on file  Occupational History   Not on file  Tobacco Use   Smoking status: Never   Smokeless tobacco: Never  Vaping Use   Vaping status: Never Used  Substance and Sexual Activity   Alcohol use: Not Currently    Comment: no alcohol since 2019   Drug use: No   Sexual activity: Yes    Birth control/protection: None  Other Topics Concern   Not on file  Social History Narrative   Not on file   Social Drivers of Health   Financial Resource  Strain: Not on file  Food Insecurity: Not on file  Transportation Needs: Not on file  Physical Activity: Not on file  Stress: Not on file  Social Connections: Not on file     Family History: The patient's family history includes Arrhythmia in his mother; Bladder Cancer in his paternal uncle; Clotting disorder in his brother; Heart attack in his paternal grandfather; Supraventricular tachycardia in his mother. There is no history of Prostate cancer or Kidney cancer.  ROS:   Please see the history of present illness.     All other systems reviewed and are negative.  EKGs/Labs/Other Studies Reviewed:    The following studies were reviewed today:  EKG Interpretation Date/Time:  Monday December 30 2023 11:32:46 EDT Ventricular Rate:  50 PR Interval:  166 QRS Duration:  80 QT Interval:  416 QTC Calculation: 379 R Axis:   -33  Text Interpretation: Sinus bradycardia Possible Left atrial enlargement Left axis deviation Confirmed by Darliss Rogue ([id number]) on 12/30/2023 12:14:20 PM    Recent Labs: 11/21/2023: ALT 16; BUN 11; Creatinine, Ser 0.87; Hemoglobin 15.7; Platelets 183; Potassium 3.5; Sodium 141  Recent Lipid Panel No results found for: CHOL, TRIG, HDL, CHOLHDL, VLDL, LDLCALC, LDLDIRECT   Risk Assessment/Calculations:             Physical Exam:    VS:  BP 122/68 (BP Location: Left Arm, Patient Position: Sitting, Cuff Size: Normal)   Pulse (!) 50   Ht 5' 6 (1.676 m)   Wt 141 lb 3.2 oz (64 kg)   SpO2 98%   BMI 22.79 kg/m     Wt Readings from Last 3 Encounters:  12/30/23 141 lb 3.2 oz (64 kg)  11/26/23 138 lb 4 oz (62.7 kg)  11/21/23 135 lb (61.2 kg)     GEN:  Well nourished, well developed in no acute distress HEENT: Normal NECK: No JVD; No carotid bruits CARDIAC: Regular rate and rhythm RESPIRATORY:  Clear to auscultation without rales, wheezing or rhonchi  ABDOMEN: Soft, non-tender, non-distended MUSCULOSKELETAL:  No edema; No deformity  SKIN:  Warm and dry NEUROLOGIC:  Alert and oriented x 3 PSYCHIATRIC:  Normal affect   ASSESSMENT:    1. Paroxysmal atrial fibrillation (HCC)   2. Primary hypertension    PLAN:    In order of problems listed above:  Paroxysmal atrial fibrillation, EKG today showing sinus bradycardia, heart rate 50.  Reduce Lopressor  to 12.5 mg twice daily.  Continue Eliquis  5 mg twice daily.  Echocardiogram 6/25 EF 60 to 65%.  Hypertension, BP controlled.  Lopressor  as above.  Follow-up in 6 months.     Medication Adjustments/Labs and Tests Ordered: Current medicines are reviewed at length with the patient today.  Concerns regarding medicines are outlined above.  Orders Placed This Encounter  Procedures   EKG 12-Lead   Meds ordered this encounter  Medications   metoprolol  tartrate (LOPRESSOR ) 25 MG tablet    Sig: Take 0.5 tablets (12.5 mg total) by mouth 2 (two) times daily.    Dispense:  60 tablet    Refill:  11    Patient Instructions  Medication Instructions:  - reduce metoprolol  to 12.5 mg twice a day   *If you need a refill on your cardiac medications before your next appointment, please call your pharmacy*  Lab Work: No labs ordered today  If you have labs (blood work) drawn today and your tests are completely normal, you will receive your results only by: MyChart Message (if you have MyChart) OR A paper copy in the mail If you have any lab test that is abnormal or we need to change your treatment, we will call you to review the results.  Testing/Procedures: No test ordered today   Follow-Up: At [facility], you and your health needs are our priority.  As part of our continuing mission to provide you with exceptional heart care, our providers are all part of one team.  This team includes your primary Cardiologist (physician) and Advanced Practice Providers or APPs (Physician Assistants and Nurse Practitioners) who all work together to provide you with the care you need, when  you need it.  Your next appointment:   6 month(s)  Provider:   You may see one of the following Advanced Practice Providers on your designated Care Team:   Lonni Meager, NP Lesley Maffucci, PA-C Bernardino Bring, PA-C Cadence [place], PA-C Tylene Lunch, NP Barnie Hila, NP    We recommend signing up for the patient portal called MyChart.  Sign up information is provided on this After Visit Summary.  MyChart is used to connect with patients for Virtual Visits (Telemedicine).  Patients are able to view lab/test results, encounter notes, upcoming appointments, etc.  Non-urgent messages can be sent to your provider as well.   To learn more about what you can do with MyChart, go to [email or web address].          Signed, Redell Cave, MD  12/30/2023 1:37 PM    [facility]

## 2024-03-21 ENCOUNTER — Other Ambulatory Visit: Payer: Self-pay | Admitting: Cardiology

## 2024-03-23 NOTE — Telephone Encounter (Signed)
 Prescription refill request for Eliquis  received. Indication:afib Last office visit:6/25 Scr:0.87  5/25 Age: 68 Weight:64  kg  Prescription refilled

## 2024-07-08 NOTE — Progress Notes (Signed)
 "  Cardiology Clinic Note   Date: 07/09/2024 ID: Jelan, Batterton July 21, 1955, MRN [id number]  Primary Cardiologist:  Redell Cave, MD  Chief Complaint   Jordan Hughes is a 69 y.o. male who presents to the clinic today for routine follow up.   Patient Profile   SINGLETON HICKOX is followed by Dr. Cave for the history outlined below.      Past medical history significant for: PAF. Onset May 2025. Echo 12/10/2023: EF 60 to 65%.  No RWMA.  Normal diastolic parameters.  Normal RV size/function.  Normal PA pressure.  Mild LAE.  Mild to moderate MR/AI.  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 Hypertension. Hypertension. RA. Psoriatic arthritis.  In summary, patient was first evaluated by Dr. Cave on 11/26/2023 for A-fib.  Patient was scheduled for lithotripsy on 11/21/2023 which was subsequently canceled when he was noted to have elevated heart rates and A-fib.  He was sent to the ED and noted to be in A-fib with RVR heart rate in the 120s to 140s.  He was given IV Cardizem  with improvement in heart rate to the 70s.  He was started on metoprolol  twice daily prior to discharge.  At the time of his office visit he was in A-fib with HR 98 bpm.  He was started on Eliquis  with plan for cardioversion after 1 month follow-up.  Echo demonstrated normal LV/RV function.  Of note, patient was seen by [facility] cardiology in November 2023 for tachycardia prior to colonoscopy.  He reported he was noted to be tachycardic prior to colonoscopy and was referred to cardiology for further evaluation.  He denied palpitations.  He had an echo which demonstrated normal LV/RV function with mild LVH and moderate MR.  Lexiscan demonstrated no evidence of ischemia.  Patient was last seen in the office by Dr. Cave on 12/30/2023 for close follow-up.  EKG demonstrated sinus bradycardia 58 bpm.  Patient was instructed to decrease metoprolol  to 12.5 mg twice daily.     History of Present Illness    Today,  patient is accompanied by his wife. Patient denies shortness of breath, dyspnea on exertion, lower extremity edema, orthopnea or PND. No chest pain, pressure, or tightness.  He reports occasional palpitations described as racing occurring approximately 2 times a month.  He reports being sedentary and not pursuing his former hobbies.  When asked about this further his wife states he should not be doing any of the woodworking or other activities he was participating in prior to being diagnosed with A-fib because of being on a blood thinner.  Patient has not had lithotripsy performed for his large kidney stone because per his wife the surgeon will not do it now that he is on the blood thinner.  Had a long conversation about being able to pursue hobbies as well as undergo surgery.  Explained how preoperative risk assessment is performed and what factors are looked at.    ROS: All other systems reviewed and are otherwise negative except as noted in History of Present Illness.  EKGs/Labs Reviewed    EKG Interpretation Date/Time:  Thursday July 09 2024 14:30:14 EST Ventricular Rate:  53 PR Interval:  168 QRS Duration:  76 QT Interval:  410 QTC Calculation: 384 R Axis:   -51  Text Interpretation: Sinus bradycardia Possible Left atrial enlargement Left axis deviation When compared with ECG of 30-Dec-2023 11:32, No significant change was found Confirmed by Loistine Sober [phone]) on 07/09/2024 2:50:00 PM   Labs reviewed from outside facility  06/15/2024: WBC 9, hemoglobin 18.5, hematocrit 56.3, sodium 143, potassium 4.4, BUN 9, creatinine 1.1, ALT 11, AST 15.  Risk Assessment/Calculations     CHA2DS2-VASc Score = 1   This indicates a 0.6% annual risk of stroke. The patient's score is based upon: CHF History: 0 HTN History: 0 Diabetes History: 0 Stroke History: 0 Vascular Disease History: 0 Age Score: 1 Gender Score: 0              Physical Exam    VS:  BP 118/80 (BP Location: Left  Arm, Patient Position: Sitting, Cuff Size: Normal)   Pulse (!) 53 Comment: 60 oximeter  Ht 5' 6 (1.676 m)   Wt 151 lb 12.8 oz (68.9 kg)   SpO2 98%   BMI 24.50 kg/m  , BMI Body mass index is 24.5 kg/m.  GEN: Well nourished, well developed, in no acute distress. Neck: No JVD or carotid bruits. Cardiac:  RRR.  No murmur. No rubs or gallops.   Respiratory:  Respirations regular and unlabored. Clear to auscultation without rales, wheezing or rhonchi. GI: Soft, nontender, nondistended. Extremities: Radials/DP/PT 2+ and equal bilaterally. No clubbing or cyanosis. No edema   Skin: Warm and dry, no rash. Neuro: Strength intact.  Assessment & Plan   PAF Onset May 2025.  A-fib with RVR was detected prior to undergoing lithotripsy on 11/21/2023.  HR in the 120s to 140s.  He will was sent to the ED where he received IV Cardizem  with improvement of heart rate.  He was discharged on metoprolol  twice daily and instructed to follow-up with cardiology as an outpatient.  He was started on Eliquis  with plan for cardioversion after 4 weeks of anticoagulation uninterrupted.  Upon follow-up in June 2025 he was back in sinus rhythm.  Denies spontaneous bleeding concerns.  Patient reports occasional palpitations described as racing occurring approximately 2 times a month.  He denies lightheadedness, dizziness, presyncope, syncope.  He is not pursuing his normal hobbies of woodworking secondary to being on a blood thinner.  He also has not had lithotripsy for his kidney stone because per his wife the surgeon will not do it now that he is on a blood thinner.  Had a long discussion regarding preoperative risk and providing the surgeon with recommendations for temporary hold of anticoagulation to be able to undergo surgery.  Attempted to provide reassurance.  Patient is encouraged to return to pleasurable activities.  EKG today shows sinus bradycardia 53 bpm. - Continue Eliquis , metoprolol .  Disposition: Return in 6  months or sooner as needed.         Signed, Barnie HERO. Galit Urich, DNP, NP-C  "

## 2024-07-09 ENCOUNTER — Encounter: Payer: Self-pay | Admitting: Student

## 2024-07-09 ENCOUNTER — Ambulatory Visit: Attending: Student | Admitting: Student

## 2024-07-09 VITALS — BP 118/80 | HR 53 | Ht 66.0 in | Wt 151.8 lb

## 2024-07-09 DIAGNOSIS — I480 Paroxysmal atrial fibrillation: Secondary | ICD-10-CM | POA: Diagnosis not present

## 2024-07-09 NOTE — Patient Instructions (Signed)

## 2024-07-19 ENCOUNTER — Other Ambulatory Visit: Payer: Self-pay | Admitting: Cardiology

## 2024-07-19 DIAGNOSIS — I480 Paroxysmal atrial fibrillation: Secondary | ICD-10-CM

## 2024-07-21 NOTE — Telephone Encounter (Signed)
 Eliquis  5mg  refill request received. Patient is 69 years old, weight-68.9kg, Crea-0.87 on 11/21/23, Diagnosis-Afib, and last seen by Barnie Hila  on 07/09/24. Dose is appropriate based on dosing criteria. Will send in refill to requested pharmacy.
